# Patient Record
Sex: Male | Born: 1975 | Race: White | Hispanic: No | Marital: Married | Smoking: Never smoker
Health system: Southern US, Community
[De-identification: ages and names within clinical notes are randomized; demographics above are authoritative.]

## PROBLEM LIST (undated history)

## (undated) DIAGNOSIS — I10 Essential (primary) hypertension: Secondary | ICD-10-CM

## (undated) DIAGNOSIS — K760 Fatty (change of) liver, not elsewhere classified: Secondary | ICD-10-CM

## (undated) DIAGNOSIS — E785 Hyperlipidemia, unspecified: Secondary | ICD-10-CM

## (undated) DIAGNOSIS — E349 Endocrine disorder, unspecified: Secondary | ICD-10-CM

## (undated) HISTORY — DX: Endocrine disorder, unspecified: E34.9

## (undated) HISTORY — DX: Fatty (change of) liver, not elsewhere classified: K76.0

## (undated) HISTORY — DX: Essential (primary) hypertension: I10

## (undated) HISTORY — PX: TYMPANOSTOMY TUBE PLACEMENT: SHX32

## (undated) HISTORY — DX: Hyperlipidemia, unspecified: E78.5

---
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
## 2004-06-23 DIAGNOSIS — I10 Essential (primary) hypertension: Secondary | ICD-10-CM

## 2004-06-23 HISTORY — DX: Essential (primary) hypertension: I10

## 2007-01-04 ENCOUNTER — Encounter: Payer: Self-pay | Admitting: Family Medicine

## 2007-01-04 LAB — CONVERTED CEMR LAB: Hgb A1c MFr Bld: 5.2 %

## 2007-06-02 ENCOUNTER — Ambulatory Visit: Payer: Self-pay | Admitting: Family Medicine

## 2007-06-02 DIAGNOSIS — B351 Tinea unguium: Secondary | ICD-10-CM | POA: Insufficient documentation

## 2007-06-02 DIAGNOSIS — E785 Hyperlipidemia, unspecified: Secondary | ICD-10-CM | POA: Insufficient documentation

## 2007-06-02 LAB — CONVERTED CEMR LAB
ALT: 61 units/L — ABNORMAL HIGH (ref 0–53)
BUN: 23 mg/dL (ref 6–23)
CO2: 25 meq/L (ref 19–32)
Cholesterol: 206 mg/dL — ABNORMAL HIGH (ref 0–200)
Creatinine, Ser: 0.98 mg/dL (ref 0.40–1.50)
HDL: 37 mg/dL — ABNORMAL LOW (ref >39)
Total Bilirubin: 1.7 mg/dL — ABNORMAL HIGH (ref 0.3–1.2)
Total CHOL/HDL Ratio: 5.6
VLDL: 21 mg/dL (ref 0–40)

## 2007-06-03 ENCOUNTER — Encounter: Payer: Self-pay | Admitting: Family Medicine

## 2007-10-12 ENCOUNTER — Encounter: Payer: Self-pay | Admitting: Family Medicine

## 2007-10-25 ENCOUNTER — Encounter: Payer: Self-pay | Admitting: Family Medicine

## 2008-10-30 ENCOUNTER — Ambulatory Visit: Payer: Self-pay | Admitting: Family Medicine

## 2008-10-30 DIAGNOSIS — R030 Elevated blood-pressure reading, without diagnosis of hypertension: Secondary | ICD-10-CM | POA: Insufficient documentation

## 2008-10-30 DIAGNOSIS — I10 Essential (primary) hypertension: Secondary | ICD-10-CM | POA: Insufficient documentation

## 2008-10-30 DIAGNOSIS — R05 Cough: Secondary | ICD-10-CM

## 2008-10-30 DIAGNOSIS — R059 Cough, unspecified: Secondary | ICD-10-CM | POA: Insufficient documentation

## 2008-10-31 ENCOUNTER — Encounter: Payer: Self-pay | Admitting: Family Medicine

## 2008-11-01 ENCOUNTER — Telehealth (INDEPENDENT_AMBULATORY_CARE_PROVIDER_SITE_OTHER): Payer: Self-pay | Admitting: *Deleted

## 2008-11-01 LAB — CONVERTED CEMR LAB
ALT: 59 units/L — ABNORMAL HIGH (ref 0–53)
Albumin: 4.5 g/dL (ref 3.5–5.2)
CO2: 24 meq/L (ref 19–32)
Calcium: 9.8 mg/dL (ref 8.4–10.5)
Chloride: 108 meq/L (ref 96–112)
Cholesterol: 185 mg/dL (ref 0–200)
Glucose, Bld: 94 mg/dL (ref 70–99)
PSA, Free Pct: 20 — ABNORMAL LOW (ref >25)
Potassium: 4.5 meq/L (ref 3.5–5.3)
Sodium: 143 meq/L (ref 135–145)
Total Protein: 6.8 g/dL (ref 6.0–8.3)
Triglycerides: 150 mg/dL — ABNORMAL HIGH (ref <150)

## 2009-03-06 ENCOUNTER — Telehealth: Payer: Self-pay | Admitting: Family Medicine

## 2009-03-06 DIAGNOSIS — R748 Abnormal levels of other serum enzymes: Secondary | ICD-10-CM | POA: Insufficient documentation

## 2009-03-07 ENCOUNTER — Encounter: Payer: Self-pay | Admitting: Family Medicine

## 2009-03-09 LAB — CONVERTED CEMR LAB: ALT: 38 units/L (ref 0–53)

## 2009-03-16 ENCOUNTER — Telehealth (INDEPENDENT_AMBULATORY_CARE_PROVIDER_SITE_OTHER): Payer: Self-pay | Admitting: *Deleted

## 2009-03-22 ENCOUNTER — Ambulatory Visit: Payer: Self-pay | Admitting: Family Medicine

## 2009-03-22 DIAGNOSIS — H9209 Otalgia, unspecified ear: Secondary | ICD-10-CM | POA: Insufficient documentation

## 2009-07-06 ENCOUNTER — Ambulatory Visit: Payer: Self-pay | Admitting: Family Medicine

## 2009-07-06 DIAGNOSIS — J019 Acute sinusitis, unspecified: Secondary | ICD-10-CM | POA: Insufficient documentation

## 2010-04-09 ENCOUNTER — Ambulatory Visit: Payer: Self-pay | Admitting: Family Medicine

## 2010-04-16 ENCOUNTER — Encounter: Payer: Self-pay | Admitting: Family Medicine

## 2010-04-22 LAB — CONVERTED CEMR LAB
ALT: 71 units/L — ABNORMAL HIGH (ref 0–53)
Albumin: 4.5 g/dL (ref 3.5–5.2)
CO2: 28 meq/L (ref 19–32)
Cholesterol: 249 mg/dL — ABNORMAL HIGH (ref 0–200)
Glucose, Bld: 97 mg/dL (ref 70–99)
LDL Cholesterol: 189 mg/dL — ABNORMAL HIGH (ref 0–99)
Potassium: 4.5 meq/L (ref 3.5–5.3)
Sodium: 140 meq/L (ref 135–145)
Total Bilirubin: 0.9 mg/dL (ref 0.3–1.2)
Total Protein: 6.6 g/dL (ref 6.0–8.3)
Triglycerides: 103 mg/dL (ref <150)
VLDL: 21 mg/dL (ref 0–40)

## 2010-04-26 ENCOUNTER — Encounter: Admission: RE | Admit: 2010-04-26 | Discharge: 2010-04-26 | Payer: Self-pay | Admitting: Family Medicine

## 2010-06-03 ENCOUNTER — Ambulatory Visit: Payer: Self-pay | Admitting: Family Medicine

## 2010-06-03 DIAGNOSIS — K648 Other hemorrhoids: Secondary | ICD-10-CM | POA: Insufficient documentation

## 2010-06-04 ENCOUNTER — Encounter: Payer: Self-pay | Admitting: Family Medicine

## 2010-07-23 NOTE — Assessment & Plan Note (Signed)
Summary: sinusitis   Vital Signs:  Patient profile:   35 year old male Height:      74.5 inches Weight:      269 pounds BMI:     34.20 O2 Sat:      100 % on Room air Temp:     98.1 degrees F oral Pulse rate:   67 / minute BP sitting:   145 / 87  (left arm) Cuff size:   large  Vitals Entered By: Payton Spark CMA (July 06, 2009 4:01 PM)  O2 Flow:  Room air CC: Congestion x 2 weeks.    Primary Care Provider:  Seymour Bars DO  CC:  Congestion x 2 weeks. Marland Kitchen  History of Present Illness: 35 yo WM presents for head congestion x 2 wks.  Started with ST and runny nose with postnasal drip.  He has taken a full box of Mucinex which helped but he is not getting better.  He is blowing out yellow mucous.  Some HAs.  Sinus pressure behind the nose.  No cough.  Using Nyquil.  No ear pain.  No GI upset.  His daughter had a cold.    Allergies: No Known Drug Allergies  Past History:  Past Medical History: Reviewed history from 10/25/2007 and no changes required. HTN in 2006, treated for only 6 mos hyperlipidemia hx of fatty liver hx of AST/ALT elevation from statin  Past Surgical History: Reviewed history from 06/02/2007 and no changes required. T tubes Sinus polyps 1995  Social History: Reviewed history from 06/02/2007 and no changes required. 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 Never smoked. Occas ETOH. 3 coffee/ day. Walks 20 min 2 x a wk, eats healthy  Review of Systems      See HPI  Physical Exam  General:  alert, well-developed, well-nourished, and well-hydrated.   Head:  normocephalic and atraumatic.  tender over frontal sinuses Eyes:  conjunctiva clear Ears:  EACs patent; TMs translucent and gray with good cone of light and bony landmarks.  Nose:  nasal congestion present with boggy turbinates Mouth:  throat mildly injected with clear postnasal drip Neck:  no masses.   Lungs:  Normal respiratory effort, chest expands  symmetrically. Lungs are clear to auscultation, no crackles or wheezes. Heart:  Normal rate and regular rhythm. S1 and S2 normal without gallop, murmur, click, rub or other extra sounds. Skin:  color normal.   Cervical Nodes:  No lymphadenopathy noted   Impression & Recommendations:  Problem # 1:  ACUTE SINUSITIS, UNSPECIFIED (ICD-461.9) Treat with Amoxicillin for 10 days.  Use Mucinex D and Ibuprofen as needed.  Call if not improved in 10 days.   His updated medication list for this problem includes:    Amoxicillin 500 Mg Caps (Amoxicillin) .Marland Kitchen... 1 capsule by mouth three times a day x 10 days  Complete Medication List: 1)  Crestor 5 Mg Tabs (Rosuvastatin calcium) .... Take 1 tablet by mouth once a day at bedtime 2)  Proair Hfa 108 (90 Base) Mcg/act Aers (Albuterol sulfate) .... 2 puffs 3-4 x a day as needed 3)  Amoxicillin 500 Mg Caps (Amoxicillin) .Marland Kitchen.. 1 capsule by mouth three times a day x 10 days  Patient Instructions: 1)  Take 10 days of Amoxicillin for sinusitis. 2)  Use Mucinex D and Advil as needed for symptomatic relief.   3)  Call if not improved in 10 days. Prescriptions: AMOXICILLIN 500 MG CAPS (AMOXICILLIN) 1 capsule by mouth  three times a day x 10 days  #30 x 0   Entered and Authorized by:   Seymour Bars DO   Signed by:   Seymour Bars DO on 07/06/2009   Method used:   Electronically to        [facility] [phone]* (retail)       [address] [place], [place]  [address]       Ph: [phone] or [phone]       Fax: [phone]   RxID:   [id number]

## 2010-07-23 NOTE — Assessment & Plan Note (Signed)
Summary: sinusitis/ lab order   Vital Signs:  Patient profile:   35 year old male Height:      74.5 inches Weight:      264 pounds BMI:     33.56 O2 Sat:      100 % on Room air Temp:     98.6 degrees F oral Pulse rate:   58 / minute BP sitting:   133 / 79  (left arm) Cuff size:   large  Vitals Entered By: Payton Spark CMA (April 09, 2010 11:08 AM)  O2 Flow:  Room air CC: Facial pressure and congestion x 10 days.    Primary Care Provider:  Seymour Bars DO  CC:  Facial pressure and congestion x 10 days. Marland Kitchen  History of Present Illness: 35 yo WM presents for 10-12 days of sinus pain and pressure.  His daughter has had a cold.  Denies fevers.  He is using Nyquil at night and Dayquil during the day.  He has a little dry cough.  Has mostly postnasal drip and a thick yellow rhinorrhea.  Has a scratchy throat.  No GI upset or rash.  No SOB or wheezing.    Current Medications (verified): 1)  Crestor 5 Mg  Tabs (Rosuvastatin Calcium) .... Take 1 Tablet By Mouth Once A Day At Bedtime  Allergies (verified): No Known Drug Allergies  Past History:  Past Medical History: Reviewed history from 10/25/2007 and no changes required. HTN in 2006, treated for only 6 mos hyperlipidemia hx of fatty liver hx of AST/ALT elevation from statin  Past Surgical History: Reviewed history from 06/02/2007 and no changes required. 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 Never smoked. Occas ETOH. 3 coffee/ day. Walks 20 min 2 x a wk, eats healthy  Review of Systems      See HPI  Physical Exam  General:  alert, well-developed, well-nourished, and well-hydrated.   Head:  normocephalic and atraumatic.  R ethmoid TTP  Eyes:  wears glasses; conjuntiva clear; EOMI Ears:  dull retracted TMs Nose:  nasal congestion with boggy turbinates present Mouth:  o/p slightly injected with postnasal drip and 1+  tonsilar hypertrophy Neck:  no masses.   Lungs:  Normal respiratory effort, chest expands symmetrically. Lungs are clear to auscultation, no crackles or wheezes. Heart:  Normal rate and regular rhythm. S1 and S2 normal without gallop, murmur, click, rub or other extra sounds. Skin:  color normal and no rashes.   Cervical Nodes:  shotty submandibular nodes   Impression & Recommendations:  Problem # 1:  ACUTE SINUSITIS, UNSPECIFIED (ICD-461.9) Treat with 10 days of Amoxicillin along with Mucinex D q 12 hrs x 10 days. Call if not improving.  Suspect underlying allergies given hx of sinus surgery for polyps. His updated medication list for this problem includes:    Amoxicillin 500 Mg Caps (Amoxicillin) .Marland Kitchen... 1 capsule by mouth three times a day x 10 days  Problem # 2:  HYPERLIPIDEMIA (ICD-272.4)  Due for labs.  Order printed. His updated medication list for this problem includes:    Crestor 5 Mg Tabs (Rosuvastatin calcium) .Marland Kitchen... Take 1 tablet by mouth once a day at bedtime  Orders: T-Lipid Profile ([id number])  Complete Medication List: 1)  Crestor 5 Mg Tabs (Rosuvastatin calcium) .... Take 1 tablet by mouth once a day at bedtime 2)  Amoxicillin 500 Mg Caps (Amoxicillin) .Marland Kitchen.. 1 capsule by mouth  three times a day x 10 days  Other Orders: T-Comprehensive Metabolic Panel [id number]) T-PSA ([id number])  Patient Instructions: 1)  Update fasting labs one morning downstairs. 2)  Will call you w/ your results. 3)  Take 10 days of Amoxicillin for sinusitis. 4)  Take with OTC Mucinex D every 12 hrs for head congestion. 5)  Call if not improved after 10 days. 6)  REturn for f/u a PHYSICAL in 6 mos Prescriptions: AMOXICILLIN 500 MG CAPS (AMOXICILLIN) 1 capsule by mouth three times a day x 10 days  #30 x 0   Entered and Authorized by:   Seymour Bars DO   Signed by:   Seymour Bars DO on 04/09/2010   Method used:   [facility]ically to        [facility] [phone]* (retail)       [address] [place], [place]  [address]       Ph: [phone] or [phone]       Fax: [phone]   RxID:   [id number]    Orders Added: 1)  T-Comprehensive Metabolic Panel [[id number]] 2)  T-Lipid Profile [[id number]] 3)  T-PSA [[id number]] 4)  Est. Patient Level III [01093]

## 2010-07-25 NOTE — Consult Note (Signed)
Summary: [facility]   Imported By: Lanelle Bal 07/03/2010 09:33:03  _____________________________________________________________________  External Attachment:    Type:   Image     Comment:   External Document

## 2010-07-25 NOTE — Assessment & Plan Note (Signed)
Summary: bleeding hemorrhoid   Vital Signs:  Patient profile:   35 year old male Height:      74.5 inches Weight:      264 pounds BMI:     33.56 O2 Sat:      99 % on Room air Pulse rate:   62 / minute BP sitting:   141 / 82  (left arm) Cuff size:   large  Vitals Entered By: Payton Spark CMA (June 03, 2010 2:51 PM)  O2 Flow:  Room air CC: ? bleeding hemorroid x 3 days.   Primary Care Provider:  Seymour Bars DO  CC:  ? bleeding hemorroid x 3 days.Marland Kitchen  History of Present Illness: 35 yo WM presents for a painful rectum x 3 days with a large BM and some blood on the toilet paper.  He had increased bleeding on Sunday even w/o a BM.  His pain has improved.  denies painful defecation today.    Denies hx of hemorrhoids, colon polyps or a fam hx of colon cancer.  He denies preceeding problems with constipation, straining or diarrhea.  Has continued to drip blood in his underwear (spotting) thru the weekend and today.  Current Medications (verified): 1)  Crestor 10 Mg Tabs (Rosuvastatin Calcium) .Marland Kitchen.. 1 Tab By Mouth Qhs  Allergies (verified): No Known Drug Allergies  Past History:  Past Medical History: Reviewed history from 10/25/2007 and no changes required. HTN in 2006, treated for only 6 mos hyperlipidemia hx of fatty liver hx of AST/ALT elevation from statin  Social History: Reviewed history from 04/09/2010 and no changes required. 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 Never smoked. Occas ETOH. 3 coffee/ day. Walks 20 min 2 x a wk, eats healthy  Review of Systems      See HPI  Physical Exam  General:  alert, well-developed, well-nourished, and well-hydrated.   Head:  normocephalic and atraumatic.   Eyes:  no conjunctival pallor; sclera non icteric Mouth:  pharynx pink and moist.   Lungs:  Normal respiratory effort, chest expands symmetrically. Lungs are clear to auscultation, no crackles or wheezes. Heart:  Normal  rate and regular rhythm. S1 and S2 normal without gallop, murmur, click, rub or other extra sounds. Rectal:  palpable hemorrhoid at the anal sphincter with active bleeding.  grossly + for blood, non tender Skin:  color normal.     Impression & Recommendations:  Problem # 1:  HEMORRHOIDS, INTERNAL, WITH BLEEDING (ICD-455.2) Actively bleeding, non painful hemorrhoid at anal sphincter with bleeding outside of BM, 1st episode. Will treat with sitz baths, moist wipes, Colace as needed and Anucort, short term use. Due to bleeding outside of BMs, will refer him to gen surgery for treatment.   Orders: Surgical Referral (Surgery)  Complete Medication List: 1)  Crestor 10 Mg Tabs (Rosuvastatin calcium) .Marland Kitchen.. 1 tab by mouth qhs 2)  Anucort-hc 25 Mg Supp (Hydrocortisone acetate) .Marland Kitchen.. 1 suppository pr daily x 7 days  Patient Instructions: 1)  Sit in warm bath water for 10 min 1-2 x a day to shrink hemorrhoid size. 2)  Use moist wipes with each BM. 3)  Use Colace if needed as a stool softener. 4)  Use Rx Anucort once daily. 5)  Will set you up with surgeon here for possible injection. 6)  Call if any increased pain or bleeding occurs. Prescriptions: ANUCORT-HC 25 MG SUPP (HYDROCORTISONE ACETATE) 1 suppository PR daily x 7 days  #7 x 0  Entered and Authorized by:   Seymour Bars DO   Signed by:   Seymour Bars DO on 06/03/2010   Method used:   [facility]ronically to        [facility] [phone][facility] (retail)       [address] [place], [place]  [address]       Ph: [phone] or [phone]       Fax: [phone]   RxID:   [id number]    Orders Added: 1)  Surgical Referral [Surgery] 2)  Est. Patient Level III [34742]

## 2010-07-29 ENCOUNTER — Telehealth: Payer: Self-pay | Admitting: Family Medicine

## 2010-07-31 ENCOUNTER — Encounter: Payer: Self-pay | Admitting: Family Medicine

## 2010-08-01 LAB — CONVERTED CEMR LAB
ALT: 67 U/L — ABNORMAL HIGH
AST: 33 U/L
Albumin: 4.6 g/dL
Alkaline Phosphatase: 45 U/L
Bilirubin, Direct: 0.2 mg/dL
Direct LDL: 96 mg/dL
Indirect Bilirubin: 1.2 mg/dL — ABNORMAL HIGH
Total Bilirubin: 1.4 mg/dL — ABNORMAL HIGH
Total Protein: 6.5 g/dL

## 2010-08-08 NOTE — Progress Notes (Signed)
Summary: Labwork  Phone Note Call from Patient Call back at [facility] Phone [phone]   Caller: Patient Summary of Call: Pt is going to the lab tomorrow morning for his bloodwork, pls send the orders Initial call taken by: Lannette Donath,  July 29, 2010 1:05 PM  Follow-up for Phone Call        printed. Follow-up by: Seymour Bars DO,  July 29, 2010 1:12 PM

## 2010-08-22 ENCOUNTER — Encounter: Payer: Self-pay | Admitting: Family Medicine

## 2010-09-10 NOTE — Consult Note (Signed)
Summary: [facility]   Imported By: Maryln Gottron 09/03/2010 09:34:04  _____________________________________________________________________  External Attachment:    Type:   Image     Comment:   External Document

## 2010-10-14 ENCOUNTER — Encounter: Payer: Self-pay | Admitting: Family Medicine

## 2010-10-15 ENCOUNTER — Encounter: Payer: Self-pay | Admitting: Family Medicine

## 2010-10-30 ENCOUNTER — Other Ambulatory Visit: Payer: Self-pay | Admitting: Family Medicine

## 2011-01-30 ENCOUNTER — Other Ambulatory Visit: Payer: Self-pay | Admitting: Family Medicine

## 2011-04-17 ENCOUNTER — Ambulatory Visit (INDEPENDENT_AMBULATORY_CARE_PROVIDER_SITE_OTHER): Payer: Managed Care, Other (non HMO) | Admitting: Family Medicine

## 2011-04-17 ENCOUNTER — Encounter: Payer: Self-pay | Admitting: Family Medicine

## 2011-04-17 DIAGNOSIS — R5383 Other fatigue: Secondary | ICD-10-CM | POA: Insufficient documentation

## 2011-04-17 DIAGNOSIS — N529 Male erectile dysfunction, unspecified: Secondary | ICD-10-CM | POA: Insufficient documentation

## 2011-04-17 DIAGNOSIS — E785 Hyperlipidemia, unspecified: Secondary | ICD-10-CM

## 2011-04-17 DIAGNOSIS — R030 Elevated blood-pressure reading, without diagnosis of hypertension: Secondary | ICD-10-CM

## 2011-04-17 DIAGNOSIS — R5381 Other malaise: Secondary | ICD-10-CM

## 2011-04-17 MED ORDER — ROSUVASTATIN CALCIUM 20 MG PO TABS: 20.0000 mg | ORAL_TABLET | Freq: Every day | ORAL | Status: DC

## 2011-04-17 MED ORDER — TADALAFIL 10 MG PO TABS: 10.0000 mg | ORAL_TABLET | ORAL | Status: DC | PRN

## 2011-04-17 NOTE — Assessment & Plan Note (Signed)
Erectile dysfunction we will check a testosterone level BXII TSH and as well as lipid panel. Also do CBC and CMP.  We'll have him come back for complete physical in about 4 months. We discussed options such as Cialis10 mg when necessary on a daily basis as needed. Another option is 5 mg Cialis every day but he says when he wants to come in today. Just by taking Cialis 10 mg and having peace of mind knowing that he'll be able to perform adequately when needed may alleviate the problem and may take care of this we'll try 10 mg first.

## 2011-04-17 NOTE — Patient Instructions (Signed)
Erectile Dysfunction Erectile dysfunction (ED) is the inability to get a good enough erection to have sexual intercourse. ED may involve:  Inability to get an erection.   Lack of enough hardness to allow penetration.   Loss of the erection before sex is finished.   Premature ejaculation.   Any combination of these problems if they occur more than 25% of the time.  CAUSES  Certain drugs, such as:   Pain relievers.   Antihistamines.   Antidepressants.   Blood pressure medicines.   Water pills.   Ulcer medicines.   Muscle relaxants.   Illegal drugs.   Excessive drinking.   Psychological causes, such as:   Anxiety.   Depression.   Sadness.   Exhaustion.   Performance fear.   Stress.   Physical causes, such as:   Artery problems. This may include diabetes, smoking, liver disease, or atherosclerosis.   High blood pressure.   Hormonal problems, such as low testosterone.   Obesity.   Nerve problems. This may include back or pelvic injuries, diabetes, multiple sclerosis, Parkinson's disease, or some surgeries.  SYMPTOMS  Inability to get an erection.   Lack of enough hardness to allow penetration.   Loss of the erection before sex is finished.   Premature ejaculation.   Normal erections at some times, but with frequent unsatisfactory episodes.   Orgasms that are not satisfactory in sensation or frequency.   Low sexual satisfaction in either partner because of erection problems.   A curved penis occurring with erection. The curve may cause pain or may be too curved to allow for intercourse.   Never having nighttime erections.  DIAGNOSIS Your caregiver can often diagnose this condition by:  Performing a physical exam to find other diseases or specific problems with the penis.   Asking you detailed questions about the problem.   Performing blood tests to check for diabetes or to measure hormone levels.   Performing urine tests to find other  underlying health conditions.   Performing an ultrasound to check for scarring.   Performing a test to check blood flow to the penis.   Doing a sleep study at home to measure nighttime erections.  TREATMENT   You may be prescribed medicines by mouth.   You may be given medicine injections into the penis.   You may be prescribed a vacuum pump with a ring.   Penile implant surgery may be performed. You may receive:   An inflatable implant.   A semi-rigid implant.   Blood vessel surgery may be performed.  HOME CARE INSTRUCTIONS  Take all medicine as directed by your caregiver. Do not take any other medicines without talking to your caregiver first.   Follow your caregiver's directions for specific treatments as prescribed.   Follow up with your caregiver as directed.  Document Released: 06/06/2000 Document Revised: 02/19/2011 Document Reviewed: 09/29/2010 [facility] Patient Information 2012 [place], [place].  Blood Pressure Record Sheet Your blood pressure on this visit to the Emergency Department or Clinic is elevated. This does not necessarily mean you have hypertension, but it does mean that your blood pressure needs to be rechecked. Many times blood pressure increases because of illness, pain, anxiety, or other factors. We recommend that you get a series of blood pressure readings done over a period of about 5 days. It is best to get a reading in the morning and one in the evening. You should make sure you sit and relax for 1 to 5 minutes before the reading  is taken. Write the readings down and make a follow-up appointment with your doctor to discuss the results. If there is not a free clinic or a drug store with blood pressure taking machines near you, you can purchase good blood pressure taking equipment from a drug store, often for less than the price of a physician's office call. Having one in the home also allows you the convenience of taking your blood pressure while you are  home and in relaxed surroundings. Your blood pressure in the Emergency Department or Clinic on ________ was ____________________. BLOOD PRESSURE LOG Date: _______________________  a.m. _____________________   p.m. _____________________  Date: _______________________  a.m. _____________________   p.m. _____________________  Date: _______________________  a.m. _____________________   p.m. _____________________  Date: _______________________  a.m. _____________________   p.m. _____________________  Date: _______________________  a.m. _____________________   p.m. _____________________  Document Released: 03/08/2003 Document Revised: 02/19/2011 Document Reviewed: 06/09/2005 [employer] Patient Information 2012 [place], [place].

## 2011-04-17 NOTE — Progress Notes (Signed)
  Subjective:    Patient ID: Shawn Richards, male    DOB: 08-17-1975, 35 y.o.   MRN: [id number]  HPI Comments: Has been on Crestor 10 mg weekly Need to check another lipid panel spent over a year since his last one. He also requests that he could go up to 20 mg and take a half tablet to one tablet daily dispense as his parents are doing since they are  also taking Crestor.  Erectile Dysfunction This is a recurrent problem. The current episode started more than 1 month ago. The problem has been gradually worsening since onset. The nature of his difficulty is maintaining erection. He reports his erection duration to be 1 to 5 minutes. Irritative symptoms do not include frequency, nocturia or urgency. Obstructive symptoms do not include dribbling, an intermittent stream or straining. The symptoms are aggravated by nothing. Past treatments include nothing. He has had no adverse reactions caused by medications. Risk factors include no known risk factors.  Hyperlipidemia This is a chronic problem. The current episode started more than 1 year ago. The problem is controlled. Recent lipid tests were reviewed and are variable. Factors aggravating his hyperlipidemia include no known factors. Current antihyperlipidemic treatment includes statins. The current treatment provides significant improvement of lipids. There are no compliance problems.  Risk factors for coronary artery disease include family history.   Elevated blood pressure he's had a history of elevation his blood pressure he's not taking medication for that he discounts the doses as his body habitus. Shawn Richards is more than body habitus and that if his blood pressures were staying this high we definitely need to  treat.   Review of Systems  Genitourinary: Negative for urgency, frequency and nocturia.       Objective:   Physical Exam  Constitutional: He appears well-developed and well-nourished.  Abdominal: Hernia confirmed negative in the  right inguinal area and confirmed negative in the left inguinal area.  Genitourinary: Testes normal and penis normal. No phimosis, paraphimosis, penile erythema or penile tenderness. No discharge found.          Assessment & Plan:

## 2011-04-17 NOTE — Assessment & Plan Note (Signed)
Will obtain a hepatic and lipid panel since some of his liver enzymes were elevated and will check a lipid disease on Crestor 10 mg. He did request that we could increase his Crestor to 20 and maintaining a half tablet if itwas  possibility.

## 2011-04-17 NOTE — Assessment & Plan Note (Signed)
Elevated blood pressure. I discussed my concern asked Mr. Shawn Richards to take the log at least once every 2 weeks and his blood pressure to home bring it back and comes in for physical with it to make a determination about blood pressure medicine.

## 2011-04-22 ENCOUNTER — Telehealth: Payer: Self-pay | Admitting: *Deleted

## 2011-04-22 LAB — CBC WITH DIFFERENTIAL/PLATELET
Basophils Absolute: 0 10*3/uL (ref 0.0–0.1)
Eosinophils Relative: 3 % (ref 0–5)
Lymphocytes Relative: 30 % (ref 12–46)
Lymphs Abs: 1.5 10*3/uL (ref 0.7–4.0)
Neutro Abs: 2.9 10*3/uL (ref 1.7–7.7)
Neutrophils Relative %: 58 % (ref 43–77)
Platelets: 186 10*3/uL (ref 150–400)
RBC: 4.67 MIL/uL (ref 4.22–5.81)
RDW: 13.9 % (ref 11.5–15.5)
WBC: 5.1 10*3/uL (ref 4.0–10.5)

## 2011-04-22 LAB — COMPREHENSIVE METABOLIC PANEL
ALT: 66 U/L — ABNORMAL HIGH (ref 0–53)
CO2: 30 mEq/L (ref 19–32)
Calcium: 9.9 mg/dL (ref 8.4–10.5)
Chloride: 106 mEq/L (ref 96–112)
Glucose, Bld: 99 mg/dL (ref 70–99)
Sodium: 143 mEq/L (ref 135–145)
Total Protein: 6.6 g/dL (ref 6.0–8.3)

## 2011-04-22 LAB — LIPID PANEL
Cholesterol: 156 mg/dL (ref 0–200)
LDL Cholesterol: 102 mg/dL — ABNORMAL HIGH (ref 0–99)
Total CHOL/HDL Ratio: 3.8 Ratio
VLDL: 13 mg/dL (ref 0–40)

## 2011-04-22 NOTE — Progress Notes (Signed)
Addended by: Ellsworth Lennox on: 04/22/2011 08:28 AM   Modules accepted: Orders

## 2011-04-23 LAB — TESTOSTERONE, FREE, TOTAL, SHBG
Sex Hormone Binding: 21 nmol/L (ref 13–71)
Testosterone, Free: 60.4 pg/mL (ref 47.0–244.0)
Testosterone-% Free: 2.4 % (ref 1.6–2.9)
Testosterone: 246.43 ng/dL — ABNORMAL LOW (ref 250–890)

## 2011-05-12 ENCOUNTER — Other Ambulatory Visit: Payer: Self-pay | Admitting: *Deleted

## 2011-05-12 DIAGNOSIS — E785 Hyperlipidemia, unspecified: Secondary | ICD-10-CM

## 2011-05-12 MED ORDER — ROSUVASTATIN CALCIUM 20 MG PO TABS: 20.0000 mg | ORAL_TABLET | Freq: Every day | ORAL | Status: DC

## 2011-05-14 NOTE — Telephone Encounter (Signed)
error 

## 2011-05-26 ENCOUNTER — Encounter: Payer: Self-pay | Admitting: Family Medicine

## 2011-05-27 ENCOUNTER — Encounter: Payer: Self-pay | Admitting: Family Medicine

## 2011-05-27 ENCOUNTER — Ambulatory Visit (INDEPENDENT_AMBULATORY_CARE_PROVIDER_SITE_OTHER): Payer: Managed Care, Other (non HMO) | Admitting: Family Medicine

## 2011-05-27 VITALS — BP 136/77 | HR 56 | Ht 75.0 in | Wt 262.0 lb

## 2011-05-27 DIAGNOSIS — E291 Testicular hypofunction: Secondary | ICD-10-CM

## 2011-05-27 DIAGNOSIS — E349 Endocrine disorder, unspecified: Secondary | ICD-10-CM

## 2011-05-27 DIAGNOSIS — N529 Male erectile dysfunction, unspecified: Secondary | ICD-10-CM

## 2011-05-27 MED ORDER — TADALAFIL 20 MG PO TABS: 20.0000 mg | ORAL_TABLET | Freq: Every day | ORAL | Status: AC | PRN

## 2011-05-27 MED ORDER — TESTOSTERONE 30 MG/ACT TD SOLN: 30.0000 mg | TRANSDERMAL | Status: DC

## 2011-05-27 NOTE — Patient Instructions (Signed)
Testosterone skin gel What is this medicine? TESTOSTERONE (tes TOS ter one) is the main male hormone. It supports normal male traits such as muscle growth, facial hair, and deep voice. This gel is used in males to treat low testosterone levels. This medicine may be used for other purposes; ask your health care provider or pharmacist if you have questions. What should I tell my health care provider before I take this medicine? They need to know if you have any of these conditions: -breast cancer -diabetes -heart disease -if a male partner is pregnant or trying to get pregnant -kidney disease -liver disease -lung disease -prostate cancer, enlargement -an unusual or allergic reaction to testosterone, soy proteins, other medicines, foods, dyes, or preservatives -pregnant or trying to get pregnant -breast-feeding How should I use this medicine? This medicine is for external use only. This medicine is applied at the same time every day (preferably in the morning) to clean, dry, intact skin. If you take a bath or shower in the morning, apply the gel after the bath or shower. Follow the directions on the prescription label. Make sure that you are using your testosterone gel product correctly and applying it only to the appropriate skin area (see below). Allow the skin to dry a few minutes then cover with clothing to prevent others from coming in contact with the medicine on your skin. The gel is flammable. Avoid fire, flame, or smoking until the gel has dried. Wash your hands with soap and water after use. For AndroGel Packets: Open the packet(s) needed for your dose. You can put the entire dose into your palm all at once or just a little at a time to apply. If you prefer, you can instead squeeze the gel directly onto the area you are applying it to. Apply on the shoulders, upper arm, or abdomen as directed. Do not apply to the scrotum or genitals. Be sure you use the correct total dose. It is best to  wait 5 to 6 hours after application of the gel before showering or swimming. For AndroGel 1%: Pump the dose into the palm of your hand. You can put the entire dose into your palm all at once or just a little at a time to apply. If you prefer, you can instead pump the gel directly onto the area you are applying it to. Apply on the shoulders, upper arm, or abdomen as directed. Do not apply to the scrotum or genitals. Be sure you use the correct total dose. It is best to wait for 5 to 6 hours after application of the gel before showering or swimming. For AndroGel 1.62%: Pump the dose into the palm of your hand. Dispense one pump of gel at a time into the palm of your hand before applying it. If you prefer, you can instead pump the gel directly onto the area you are applying it to. Apply on the shoulders and upper arms as directed. Do not apply to other parts of the body including the abdomen or genitals. Be sure you use the correct total dose. It is best to wait 2 hours after application of the gel before washing, showering, or swimming. For Testim: Open the tube(s) needed for your dose. Squeeze the gel from the tube into the palm of your hand. Apply on the shoulders or upper arms as directed. Do not apply to the scrotum, genitals, or abdomen. Be sure you use the correct total dose. Do not shower or swim for at least 2   hours after application of the gel. For Fortesta: Use the multi-dose pump to pump the gel directly onto the area you are applying it to. Apply on the thighs as directed. Do not apply to the abdomen, penis, scrotum, shoulders or upper arms. Gently rub the gel onto the skin using your finger. Be sure you use the correct total dose. Do not shower or swim for at least 2 hours after application of the gel. A special MedGuide will be given to you by the pharmacist with each prescription and refill. Be sure to read this information carefully each time. Talk to your pediatrician regarding the use of this  medicine in children. Special care may be needed. Overdosage: If you think you have taken too much of this medicine contact a poison control center or emergency room at once. NOTE: This medicine is only for you. Do not share this medicine with others. What if I miss a dose? If you miss a dose, use it as soon as you can. If it is almost time for your next dose, use only that dose. Do not use double or extra doses. What may interact with this medicine? -medicines for diabetes -medicines that treat or prevent blood clots like warfarin -oxyphenbutazone -propranolol -steroid medicines like prednisone or cortisone This list may not describe all possible interactions. Give your health care provider a list of all the medicines, herbs, non-prescription drugs, or dietary supplements you use. Also tell them if you smoke, drink alcohol, or use illegal drugs. Some items may interact with your medicine. What should I watch for while using this medicine? Visit your doctor or health care professional for regular checks on your progress. They will need to check the level of testosterone in your blood. This medicine can transfer from your body to others. If a person or pet comes in contact with the area where this medicine was applied to your skin, they may have a serious risk of side effects. If you cannot avoid skin-to-skin contact with another person, make sure the site where this medicine was applied is covered with clothing. If accidental contact happens, the skin of the person or pet should be washed right away with soap and water. Also, a male partner who is pregnant or trying to get pregnant should avoid contact with the gel or treated skin. This medicine may affect blood sugar levels. If you have diabetes, check with your doctor or health care professional before you change your diet or the dose of your diabetic medicine. This drug is banned from use in athletes by most athletic organizations. What side  effects may I notice from receiving this medicine? Side effects that you should report to your doctor or health care professional as soon as possible: -allergic reactions like skin rash, itching or hives, swelling of the face, lips, or tongue -breast enlargement -breathing problems -changes in mood, especially anger, depression, or rage -dark urine -general ill feeling or flu-like symptoms -light-colored stools -loss of appetite, nausea -nausea, vomiting -right upper belly pain -stomach pain -swelling of ankles -too frequent or persistent erections -trouble passing urine or change in the amount of urine -unusually weak or tired -yellowing of the eyes or skin Side effects that usually do not require medical attention (report to your doctor or health care professional if they continue or are bothersome): -acne -change in sex drive or performance -hair loss -headache This list may not describe all possible side effects. Call your doctor for medical advice about side effects. You may   report side effects to FDA at 1-800-FDA-1088. Where should I keep my medicine? Keep out of the reach of children. This medicine can be abused. Keep your medicine in a safe place to protect it from theft. Do not share this medicine with anyone. Selling or giving away this medicine is dangerous and against the law. Store at room temperature between 15 to 30 degrees C (59 to 86 degrees F). Keep closed until use. Protect from heat and light. This medicine is flammable. Avoid exposure to heat, fire, flame, and smoking. Throw away any unused medicine after the expiration date. NOTE: This sheet is a summary. It may not cover all possible information. If you have questions about this medicine, talk to your doctor, pharmacist, or health care provider.  2012, [employer]. (10/22/2009 4:45:50 PM) 

## 2011-05-27 NOTE — Progress Notes (Signed)
  Subjective:    Patient ID: STARK AGUINAGA, male    DOB: 12/03/1975, 35 y.o.   MRN: [id number]  HPI Patient's here for reviewing of his lab work. Patient testosterone was was on the low side. With his lack of energy increased dysfunction and overall fatigue the KUB candidate for supplemental testosterone replacement.   Review of Systems  Constitutional: Positive for fatigue.  Genitourinary:       Erectile dysfunction  All other systems reviewed and are negative.   BP 136/77  Pulse 56  Ht 6\' 3"  (1.905 m)  Wt 262 lb (118.842 kg)  BMI 32.75 kg/m2  SpO2 99%    Objective:   Physical Exam  Constitutional: He is oriented to person, place, and time. He appears well-developed and well-nourished.  Neurological: He is alert and oriented to person, place, and time.  Psychiatric: He has a normal mood and affect. His behavior is normal.          Assessment & Plan:  Hypotestosteronism and erectile dysfunction. Since Cialis 5 and 10 didn't do much for him and it was not until he took the 10 he could feel anything at all will increase Cialis to 20 mg one tablet to use as needed. We'll also place him on after discussion with him since he has young children at home currently the safest testosterone supplement other than injection is going to be axillary testosterone is a and we'll place him on that medication the Axiron. Apply one dose under each arm and return in 2 months followup.

## 2011-07-07 ENCOUNTER — Encounter: Payer: Self-pay | Admitting: Physician Assistant

## 2011-07-07 ENCOUNTER — Ambulatory Visit (INDEPENDENT_AMBULATORY_CARE_PROVIDER_SITE_OTHER): Payer: Managed Care, Other (non HMO) | Admitting: Physician Assistant

## 2011-07-07 VITALS — BP 133/74 | HR 63 | Temp 98.4°F | Ht 75.0 in | Wt 261.0 lb

## 2011-07-07 DIAGNOSIS — J329 Chronic sinusitis, unspecified: Secondary | ICD-10-CM

## 2011-07-07 MED ORDER — AZITHROMYCIN 250 MG PO TABS: ORAL_TABLET | ORAL | Status: DC

## 2011-07-07 MED ORDER — AMOXICILLIN 500 MG PO TABS: 1000.0000 mg | ORAL_TABLET | Freq: Three times a day (TID) | ORAL | Status: AC

## 2011-07-07 NOTE — Progress Notes (Signed)
  Subjective:    Patient ID: Shawn Richards, male    DOB: Jun 16, 1976, 36 y.o.   MRN: [id number]  HPI Patient has had head/sinus congestion for the past 6 weeks with no relief. Recently it has gotten much worse and he feels pressure behind eyes. He has tried Mucinex DM and nyquil with no relief. He denies fever, chills, ear pain, sore throat, n/v, SOB, or wheezing.   He has also recently started having nosebleeds that have lasted for a few seconds and resolved with pressure. He usually finds they are associated with a big sneeze or cough.  Nosebleeds with hard 12 over past . Head congestion.   Review of Systems     Objective:   Physical Exam  Constitutional: He is oriented to person, place, and time. He appears well-developed and well-nourished.  HENT:  Head: Normocephalic and atraumatic.  Right Ear: External ear normal.  Left Ear: External ear normal.  Mouth/Throat: Oropharynx is clear and moist. No oropharyngeal exudate.       Maxillary tenderness with palpation.  Edematous bilateral nares with dry blood clots from nosebleeds present.   Eyes: Right eye exhibits no discharge. Left eye exhibits no discharge.  Neck: Normal range of motion. Neck supple.  Cardiovascular: Normal rate, regular rhythm and normal heart sounds.   Pulmonary/Chest: Effort normal and breath sounds normal. He has no wheezes.  Neurological: He is alert and oriented to person, place, and time.  Skin: Skin is warm and dry.  Psychiatric: He has a normal mood and affect. His behavior is normal.          Assessment & Plan:  Sinusitis- Gave amoxicillin. Instructed to use nasal saline spray to moisturize nostril to help with nosebleeds. Considered nasal steroid spray but he reported he and tried them in the past and they had not done him any good. If not improving by Friday call office and I will also prescribe a steroid taper.

## 2011-07-07 NOTE — Patient Instructions (Addendum)
Start Amoxicillin. If not better in next 48 hours then call and will prescribe prednisone for head congestion. Consider nasal saline to mosturize nostrils.

## 2011-07-25 ENCOUNTER — Telehealth: Payer: Self-pay | Admitting: Family Medicine

## 2011-07-25 NOTE — Telephone Encounter (Signed)
Patient called advised that he has an appointment Tues and would like to have lab order sent downstairs Monday 07/28/11 so his results can be back by his appointment Tues

## 2011-07-28 ENCOUNTER — Other Ambulatory Visit: Payer: Self-pay | Admitting: *Deleted

## 2011-07-28 LAB — COMPREHENSIVE METABOLIC PANEL
ALT: 55 U/L — ABNORMAL HIGH (ref 0–53)
AST: 32 U/L (ref 0–37)
Albumin: 4.7 g/dL (ref 3.5–5.2)
Alkaline Phosphatase: 46 U/L (ref 39–117)
BUN: 17 mg/dL (ref 6–23)
Potassium: 4.5 mEq/L (ref 3.5–5.3)

## 2011-07-28 NOTE — Telephone Encounter (Signed)
Repeat CMP and Test.

## 2011-07-29 ENCOUNTER — Encounter: Payer: Self-pay | Admitting: Family Medicine

## 2011-07-29 ENCOUNTER — Ambulatory Visit (INDEPENDENT_AMBULATORY_CARE_PROVIDER_SITE_OTHER): Payer: Managed Care, Other (non HMO) | Admitting: Family Medicine

## 2011-07-29 DIAGNOSIS — E785 Hyperlipidemia, unspecified: Secondary | ICD-10-CM

## 2011-07-29 DIAGNOSIS — E349 Endocrine disorder, unspecified: Secondary | ICD-10-CM

## 2011-07-29 DIAGNOSIS — R030 Elevated blood-pressure reading, without diagnosis of hypertension: Secondary | ICD-10-CM

## 2011-07-29 DIAGNOSIS — E291 Testicular hypofunction: Secondary | ICD-10-CM

## 2011-07-29 DIAGNOSIS — N529 Male erectile dysfunction, unspecified: Secondary | ICD-10-CM

## 2011-07-29 LAB — TESTOSTERONE, FREE, TOTAL, SHBG
Testosterone, Free: 155.7 pg/mL (ref 47.0–244.0)
Testosterone-% Free: 2.6 % (ref 1.6–2.9)

## 2011-07-29 MED ORDER — TESTOSTERONE 30 MG/ACT TD SOLN: 30.0000 mg | TRANSDERMAL | Status: DC

## 2011-07-29 MED ORDER — ROSUVASTATIN CALCIUM 20 MG PO TABS: 20.0000 mg | ORAL_TABLET | Freq: Every day | ORAL | Status: DC

## 2011-07-29 NOTE — Progress Notes (Signed)
Subjective:     Patient ID: Shawn Richards, male   DOB: 08/05/1975, 36 y.o.   MRN: [id number]  HPI Patient reports feeling much better since being on the testosterone replacement. He reports his libido has been restored to normal and energy levels have been restored. He reports no problems with the testosterone replacement. Hyperlipidemia he is still on his Crestor without any problems  Review of Systems Essentially unremarkable.     Filed Vitals:   07/29/11 0834  BP: 126/73  Pulse: 61   Objective:   Physical Exam Well-nourished well-developed white male no acute distress Lungs were clear to auscultation and percussion cardiovascular S1 and S2 blood pressure is under much better control today labs reviewed showing testosterone going from below normal to about mid range of 587.67    Results for orders placed in visit on 07/28/11  COMPREHENSIVE METABOLIC PANEL      Component Value Range   Sodium 141  135 - 145 (mEq/L)   Potassium 4.5  3.5 - 5.3 (mEq/L)   Chloride 104  96 - 112 (mEq/L)   CO2 27  19 - 32 (mEq/L)   Glucose, Bld 94  70 - 99 (mg/dL)   BUN 17  6 - 23 (mg/dL)   Creat 4.09  8.11 - 9.14 (mg/dL)   Total Bilirubin 1.5 (*) 0.3 - 1.2 (mg/dL)   Alkaline Phosphatase 46  39 - 117 (U/L)   AST 32  0 - 37 (U/L)   ALT 55 (*) 0 - 53 (U/L)   Total Protein 6.6  6.0 - 8.3 (g/dL)   Albumin 4.7  3.5 - 5.2 (g/dL)   Calcium 78.2  8.4 - 10.5 (mg/dL)  TESTOSTERONE, FREE, TOTAL      Component Value Range   Testosterone 587.67  250 - 890 (ng/dL)   Sex Hormone Binding       Testosterone, Free       Testosterone-% Freee.       Assessment:     #1 erectile dysfunction and fatigue secondary to a testosterone now under good control #2 elevated blood pressure without diagnosis of hypertension #3 hyperlipidemia    Plan:     #1 Will continue his Axiron daily. He could try taking it 5 days a week and see how he does if he does okay fine but if he needs to increase to go back to 7 days a  week 5.  #2 elevated blood pressure under good control now #3 hyperlipidemia continue Crestor Return in 6 months for physical examination.

## 2011-07-29 NOTE — Patient Instructions (Signed)
Return in 6 months. Call for labs for testosterone, lipid, Aic, CBC,TSH and CMP a few days before your PE.May reduce Axiron to 5 X a week .

## 2011-11-27 ENCOUNTER — Ambulatory Visit
Admission: RE | Admit: 2011-11-27 | Discharge: 2011-11-27 | Disposition: A | Discharge status: Home / Self Care | Payer: Managed Care, Other (non HMO) | Source: Ambulatory Visit | Attending: Family Medicine | Admitting: Family Medicine

## 2011-11-27 ENCOUNTER — Ambulatory Visit (INDEPENDENT_AMBULATORY_CARE_PROVIDER_SITE_OTHER): Payer: Managed Care, Other (non HMO) | Admitting: Family Medicine

## 2011-11-27 ENCOUNTER — Encounter: Payer: Self-pay | Admitting: Family Medicine

## 2011-11-27 VITALS — BP 134/83 | HR 56 | Ht 75.0 in | Wt 263.0 lb

## 2011-11-27 DIAGNOSIS — M25572 Pain in left ankle and joints of left foot: Secondary | ICD-10-CM

## 2011-11-27 DIAGNOSIS — M79672 Pain in left foot: Secondary | ICD-10-CM

## 2011-11-27 DIAGNOSIS — M109 Gout, unspecified: Secondary | ICD-10-CM

## 2011-11-27 DIAGNOSIS — M25579 Pain in unspecified ankle and joints of unspecified foot: Secondary | ICD-10-CM

## 2011-11-27 DIAGNOSIS — M79609 Pain in unspecified limb: Secondary | ICD-10-CM

## 2011-11-27 MED ORDER — COLCHICINE 0.6 MG PO TABS: ORAL_TABLET | ORAL | Status: DC

## 2011-11-27 MED ORDER — INDOMETHACIN 50 MG PO CAPS: 50.0000 mg | ORAL_CAPSULE | Freq: Three times a day (TID) | ORAL | Status: AC

## 2011-11-27 NOTE — Progress Notes (Signed)
  Subjective:    Patient ID: Shawn Richards, male    DOB: Jul 09, 1975, 36 y.o.   MRN: [id number]  Ankle Pain  The incident occurred 5 to 7 days ago (started last Thursday AM). The incident occurred at home. There was no injury mechanism. The pain is present in the left ankle. The quality of the pain is described as stabbing and aching (He states the first day or 2 it was am bad stabbing pain he could barely walk he he iced it and now it does feel somewhat better he's asked him to walk but he still has some pain and still notices swelling of the left foot). The pain is moderate. The pain has been improving since onset. Associated symptoms include an inability to bear weight and muscle weakness. Pertinent negatives include no loss of motion, loss of sensation, numbness or tingling. He reports no foreign bodies present. The symptoms are aggravated by movement and weight bearing (Initially aggravated by weightbearing that has gotten better). He has tried elevation, ice, immobilization and non-weight bearing for the symptoms. The treatment provided mild relief.    Review of Systems  Musculoskeletal: Positive for myalgias, joint swelling and gait problem.  Neurological: Negative for tingling and numbness.  All other systems reviewed and are negative.     BP 134/83  Pulse 56  Ht 6\' 3"  (1.905 m)  Wt 263 lb (119.296 kg)  BMI 32.87 kg/m2  SpO2 98% Objective:   Physical Exam  Constitutional: He is oriented to person, place, and time. He appears well-developed and well-nourished.  Musculoskeletal: He exhibits edema and tenderness.       Left ankle and foot near the is in step is swollen tender to touch and warm to touch as well.  Neurological: He is alert and oriented to person, place, and time. He has normal reflexes.  Skin: Skin is warm and dry. Rash noted. There is erythema.     Assessment & Plan:   Left ankle and foot pain without trauma more than likely first episode of gout. Will x-ray the  left foot NyQuil to make sure there is no fracture. Discussed with him the pathophysiology of gout will check a uric acid if this continues to recur and we comes back in August and September have blood work we'll try to remember to add a uric acid that time. In the meantime we'll place on colchicine 0.6 mg one tablet once to  twice a day and Indocin 50 mg one capsule one to 3 times a day with food. Return if symptoms get worse.

## 2011-11-27 NOTE — Patient Instructions (Signed)
Gout Gout is an inflammatory condition (arthritis) caused by a buildup of uric acid crystals in the joints. Uric acid is a chemical that is normally present in the blood. Under some circumstances, uric acid can form into crystals in your joints. This causes joint redness, soreness, and swelling (inflammation). Repeat attacks are common. Over time, uric acid crystals can form into masses (tophi) near a joint, causing disfigurement. Gout is treatable and often preventable. CAUSES  The disease begins with elevated levels of uric acid in the blood. Uric acid is produced by your body when it breaks down a naturally found substance called purines. This also happens when you eat certain foods such as meats and fish. Causes of an elevated uric acid level include:  Being passed down from parent to child (heredity).   Diseases that cause increased uric acid production (obesity, psoriasis, some cancers).   Excessive alcohol use.   Diet, especially diets rich in meat and seafood.   Medicines, including certain cancer-fighting drugs (chemotherapy), diuretics, and aspirin.   Chronic kidney disease. The kidneys are no longer able to remove uric acid well.   Problems with metabolism.  Conditions strongly associated with gout include:  Obesity.   High blood pressure.   High cholesterol.   Diabetes.  Not everyone with elevated uric acid levels gets gout. It is not understood why some people get gout and others do not. Surgery, joint injury, and eating too much of certain foods are some of the factors that can lead to gout. SYMPTOMS   An attack of gout comes on quickly. It causes intense pain with redness, swelling, and warmth in a joint.   Fever can occur.   Often, only one joint is involved. Certain joints are more commonly involved:   Base of the big toe.   Knee.   Ankle.   Wrist.   Finger.  Without treatment, an attack usually goes away in a few days to weeks. Between attacks, you  usually will not have symptoms, which is different from many other forms of arthritis. DIAGNOSIS  Your caregiver will suspect gout based on your symptoms and exam. Removal of fluid from the joint (arthrocentesis) is done to check for uric acid crystals. Your caregiver will give you a medicine that numbs the area (local anesthetic) and use a needle to remove joint fluid for exam. Gout is confirmed when uric acid crystals are seen in joint fluid, using a special microscope. Sometimes, blood, urine, and X-ray tests are also used. TREATMENT  There are 2 phases to gout treatment: treating the sudden onset (acute) attack and preventing attacks (prophylaxis). Treatment of an Acute Attack  Medicines are used. These include anti-inflammatory medicines or steroid medicines.   An injection of steroid medicine into the affected joint is sometimes necessary.   The painful joint is rested. Movement can worsen the arthritis.   You may use warm or cold treatments on painful joints, depending which works best for you.   Discuss the use of coffee, vitamin C, or cherries with your caregiver. These may be helpful treatment options.  Treatment to Prevent Attacks After the acute attack subsides, your caregiver may advise prophylactic medicine. These medicines either help your kidneys eliminate uric acid from your body or decrease your uric acid production. You may need to stay on these medicines for a very long time. The early phase of treatment with prophylactic medicine can be associated with an increase in acute gout attacks. For this reason, during the first few months   of treatment, your caregiver may also advise you to take medicines usually used for acute gout treatment. Be sure you understand your caregiver's directions. You should also discuss dietary treatment with your caregiver. Certain foods such as meats and fish can increase uric acid levels. Other foods such as dairy can decrease levels. Your caregiver  can give you a list of foods to avoid. HOME CARE INSTRUCTIONS   Do not take aspirin to relieve pain. This raises uric acid levels.   Only take over-the-counter or prescription medicines for pain, discomfort, or fever as directed by your caregiver.   Rest the joint as much as possible. When in bed, keep sheets and blankets off painful areas.   Keep the affected joint raised (elevated).   Use crutches if the painful joint is in your leg.   Drink enough water and fluids to keep your urine clear or pale yellow. This helps your body get rid of uric acid. Do not drink alcoholic beverages. They slow the passage of uric acid.   Follow your caregiver's dietary instructions. Pay careful attention to the amount of protein you eat. Your daily diet should emphasize fruits, vegetables, whole grains, and fat-free or low-fat milk products.   Maintain a healthy body weight.  SEEK MEDICAL CARE IF:   You have an oral temperature above 102 F (38.9 C).   You develop diarrhea, vomiting, or any side effects from medicines.   You do not feel better in 24 hours, or you are getting worse.  SEEK IMMEDIATE MEDICAL CARE IF:   Your joint becomes suddenly more tender and you have:   Chills.   An oral temperature above 102 F (38.9 C), not controlled by medicine.  MAKE SURE YOU:   Understand these instructions.   Will watch your condition.   Will get help right away if you are not doing well or get worse.  Document Released: 06/06/2000 Document Revised: 05/29/2011 Document Reviewed: 09/17/2009 [employer] Patient Information 2012 [employer]. 

## 2012-01-27 ENCOUNTER — Encounter: Payer: Self-pay | Admitting: Family Medicine

## 2012-01-27 ENCOUNTER — Ambulatory Visit (INDEPENDENT_AMBULATORY_CARE_PROVIDER_SITE_OTHER): Payer: Managed Care, Other (non HMO) | Admitting: Family Medicine

## 2012-01-27 VITALS — BP 138/80 | HR 54 | Ht 75.0 in | Wt 263.0 lb

## 2012-01-27 DIAGNOSIS — E349 Endocrine disorder, unspecified: Secondary | ICD-10-CM

## 2012-01-27 DIAGNOSIS — E785 Hyperlipidemia, unspecified: Secondary | ICD-10-CM

## 2012-01-27 DIAGNOSIS — M2550 Pain in unspecified joint: Secondary | ICD-10-CM

## 2012-01-27 DIAGNOSIS — I498 Other specified cardiac arrhythmias: Secondary | ICD-10-CM

## 2012-01-27 DIAGNOSIS — N529 Male erectile dysfunction, unspecified: Secondary | ICD-10-CM

## 2012-01-27 DIAGNOSIS — Z1211 Encounter for screening for malignant neoplasm of colon: Secondary | ICD-10-CM

## 2012-01-27 DIAGNOSIS — R001 Bradycardia, unspecified: Secondary | ICD-10-CM

## 2012-01-27 DIAGNOSIS — R030 Elevated blood-pressure reading, without diagnosis of hypertension: Secondary | ICD-10-CM

## 2012-01-27 DIAGNOSIS — Z0000 Encounter for general adult medical examination without abnormal findings: Secondary | ICD-10-CM

## 2012-01-27 DIAGNOSIS — R5383 Other fatigue: Secondary | ICD-10-CM

## 2012-01-27 MED ORDER — TESTOSTERONE 30 MG/ACT TD SOLN: 30.0000 mg | TRANSDERMAL | Status: DC

## 2012-01-27 MED ORDER — ROSUVASTATIN CALCIUM 20 MG PO TABS: 10.0000 mg | ORAL_TABLET | Freq: Every day | ORAL | Status: DC

## 2012-01-27 NOTE — Patient Instructions (Signed)

## 2012-01-27 NOTE — Progress Notes (Signed)
Subjective:    Patient ID: Shawn Richards, male    DOB: June 22, 1976, 36 y.o.   MRN: [id number]  HPI Patient is here for yearly exam.  Review of Systems  Constitutional: Positive for fatigue.  Musculoskeletal: Positive for myalgias, joint swelling and arthralgias.   No Known Allergies History   Social History  . Marital Status: Married    Spouse Name: N/A    Number of Children: N/A  . Years of Education: N/A   Occupational History  . Not on file.   Social History Main Topics  . Smoking status: Never Smoker   . Smokeless tobacco: Never Used  . Alcohol Use: 1.0 oz/week    2 drink(s) per week     occasional  . Drug Use:   . Sexually Active: Yes    Birth Control/ Protection: Surgical   Other Topics Concern  . Not on file   Social History Narrative  . No narrative on file   Family History  Problem Relation Age of Onset  . Cancer Father     prostate  . Hyperlipidemia Father   . Hyperlipidemia Mother    Past Medical History  Diagnosis Date  . Hypertension 2006    treated for only 6 months  . Hyperlipidemia   . Fatty liver   . Hypotestosteronism    Past Surgical History  Procedure Date  . Tympanostomy tube placement   . Sinus polyps 1995      BP 138/80  Pulse 54  Ht 6\' 3"  (1.905 m)  Wt 263 lb (119.296 kg)  BMI 32.87 kg/m2  SpO2 98% Objective:   Physical Exam  Vitals reviewed. Constitutional: He is oriented to person, place, and time. He appears well-developed and well-nourished.  HENT:  Head: Normocephalic and atraumatic.  Right Ear: External ear normal.  Left Ear: External ear normal.  Mouth/Throat: Oropharynx is clear and moist.  Eyes: Pupils are equal, round, and reactive to light. Right eye exhibits no discharge and no exudate. No foreign body present in the right eye. Left eye exhibits no discharge and no exudate. No foreign body present in the left eye. Right conjunctiva is not injected. Left conjunctiva is not injected.  Fundoscopic exam:     The right eye shows no arteriolar narrowing, no AV nicking and no exudate.       The left eye shows no arteriolar narrowing, no AV nicking and no exudate.  Neck: Normal range of motion. Neck supple. No thyromegaly present.  Cardiovascular: Normal rate, regular rhythm and normal heart sounds.  Exam reveals no friction rub.   No murmur heard. Pulmonary/Chest: Effort normal and breath sounds normal. No respiratory distress. He has no wheezes.  Abdominal: Soft. Bowel sounds are normal. He exhibits no distension. There is no tenderness. Hernia confirmed negative in the right inguinal area and confirmed negative in the left inguinal area.  Genitourinary: Rectum normal and prostate normal. Rectal exam shows no mass, no tenderness and anal tone normal. Guaiac negative stool. Right testis shows no mass and no tenderness. Left testis shows no mass and no tenderness. Circumcised. No penile erythema or penile tenderness. No discharge found.  Musculoskeletal: Normal range of motion. He exhibits no edema and no tenderness.  Lymphadenopathy:       Right: No inguinal adenopathy present.       Left: No inguinal adenopathy present.  Neurological: He is alert and oriented to person, place, and time. He has normal reflexes. No cranial nerve deficit. Coordination normal.  Skin: Skin  is warm and dry. He is not diaphoretic. No erythema.  Psychiatric: He has a normal mood and affect. His behavior is normal.     Results for orders placed in visit on 07/28/11  COMPREHENSIVE METABOLIC PANEL      Component Value Range   Sodium 141  135 - 145 mEq/L   Potassium 4.5  3.5 - 5.3 mEq/L   Chloride 104  96 - 112 mEq/L   CO2 27  19 - 32 mEq/L   Glucose, Bld 94  70 - 99 mg/dL   BUN 17  6 - 23 mg/dL   Creat 1.61  0.96 - 0.45 mg/dL   Total Bilirubin 1.5 (*) 0.3 - 1.2 mg/dL   Alkaline Phosphatase 46  39 - 117 U/L   AST 32  0 - 37 U/L   ALT 55 (*) 0 - 53 U/L   Total Protein 6.6  6.0 - 8.3 g/dL   Albumin 4.7  3.5 - 5.2  g/dL   Calcium 40.9  8.4 - 81.1 mg/dL  TESTOSTERONE, FREE, TOTAL      Component Value Range   Testosterone 587.67  250 - 890 ng/dL   Sex Hormone Binding 22  13 - 71 nmol/L   Testosterone, Free 155.7  47.0 - 244.0 pg/mL   Testosterone-% Freee. 2.6  1.6 - 2.9 %   EKG bradycardia is present at 48    Assessment & Plan:  #1 health maintenance will obtain routine labs. #2 hypotestosteronism will obtain a testosterone level  #3 fatigue we'll check. We'll check a B12 and TSH level.  will return in 6 months for followup

## 2012-01-30 LAB — LIPID PANEL
HDL: 35 mg/dL — ABNORMAL LOW (ref >39)
Triglycerides: 97 mg/dL (ref <150)

## 2012-01-30 LAB — CBC WITH DIFFERENTIAL/PLATELET
HCT: 43.1 % (ref 39.0–52.0)
Hemoglobin: 14.8 g/dL (ref 13.0–17.0)
Lymphs Abs: 1.6 10*3/uL (ref 0.7–4.0)
MCH: 29.7 pg (ref 26.0–34.0)
Monocytes Relative: 9 % (ref 3–12)
Neutro Abs: 2.5 10*3/uL (ref 1.7–7.7)
Neutrophils Relative %: 47 % (ref 43–77)
RBC: 4.99 MIL/uL (ref 4.22–5.81)

## 2012-01-30 LAB — COMPLETE METABOLIC PANEL WITH GFR
Albumin: 4.4 g/dL (ref 3.5–5.2)
CO2: 28 mEq/L (ref 19–32)
Calcium: 9.8 mg/dL (ref 8.4–10.5)
GFR, Est African American: >89 mL/min
GFR, Est Non African American: >89 mL/min
Glucose, Bld: 87 mg/dL (ref 70–99)
Potassium: 4.6 mEq/L (ref 3.5–5.3)
Sodium: 143 mEq/L (ref 135–145)
Total Protein: 6.7 g/dL (ref 6.0–8.3)

## 2012-02-02 LAB — TESTOSTERONE, FREE, TOTAL, SHBG
Testosterone, Free: 35.5 pg/mL — ABNORMAL LOW (ref 47.0–244.0)
Testosterone-% Free: 2.4 % (ref 1.6–2.9)

## 2012-02-07 ENCOUNTER — Encounter: Payer: Self-pay | Admitting: Family Medicine

## 2012-02-12 ENCOUNTER — Telehealth: Payer: Self-pay | Admitting: *Deleted

## 2012-02-12 DIAGNOSIS — Z0000 Encounter for general adult medical examination without abnormal findings: Secondary | ICD-10-CM

## 2012-05-12 ENCOUNTER — Other Ambulatory Visit: Payer: Self-pay | Admitting: Family Medicine

## 2012-12-01 ENCOUNTER — Ambulatory Visit (INDEPENDENT_AMBULATORY_CARE_PROVIDER_SITE_OTHER): Payer: Managed Care, Other (non HMO) | Admitting: Family Medicine

## 2012-12-01 ENCOUNTER — Encounter: Payer: Self-pay | Admitting: Family Medicine

## 2012-12-01 VITALS — BP 126/76 | HR 58 | Temp 98.1°F | Wt 259.0 lb

## 2012-12-01 DIAGNOSIS — R05 Cough: Secondary | ICD-10-CM

## 2012-12-01 DIAGNOSIS — R059 Cough, unspecified: Secondary | ICD-10-CM

## 2012-12-01 MED ORDER — BECLOMETHASONE DIPROPIONATE 80 MCG/ACT NA AERS: 2.0000 | INHALATION_SPRAY | Freq: Every day | NASAL | Status: DC

## 2012-12-01 NOTE — Progress Notes (Signed)
CC: Shawn Richards is a 37 y.o. male is here for cough x 1 month   Subjective: HPI:  Complains of cough present for 4 weeks moderate severity at onset when he was experiencing chills, subjective fevers, nasal congestion. This all resolved without intervention within 5 days however nonproductive mild cough persists. It is present on every weekday typically absent on the weekends. It is worse the longer he stays at work. Is described as annoying nothing particularly makes it better or worse other than above. It is not associated with exertion. He denies nocturnal symptoms. He's never had this before. He denies fevers, chills, nasal congestion, sore throat, chest pain, blood in sputum, productive cough, chest pain, back pain, GI disturbance, nausea, headache, shortness of breath nor orthopnea. He is not and has never been a smoker   Review Of Systems Outlined In HPI  Past Medical History  Diagnosis Date  . Hypertension 2006    treated for only 6 months  . Hyperlipidemia   . Fatty liver   . Hypotestosteronism      Family History  Problem Relation Age of Onset  . Cancer Father     prostate  . Hyperlipidemia Father   . Hyperlipidemia Mother      History  Substance Use Topics  . Smoking status: Never Smoker   . Smokeless tobacco: Never Used  . Alcohol Use: 1.0 oz/week    2 drink(s) per week     Comment: occasional     Objective: Filed Vitals:   12/01/12 1323  BP: 126/76  Pulse: 58  Temp: 98.1 F (36.7 C)    General: Alert and Oriented, No Acute Distress HEENT: Pupils equal, round, reactive to light. Conjunctivae clear.  External ears unremarkable, canals clear with intact TMs with appropriate landmarks.  Middle ear appears open without effusion. Pink inferior turbinates.  Moist mucous membranes, pharynx without inflammation nor lesions moderate cobblestoning.  Neck supple without palpable lymphadenopathy nor abnormal masses. Lungs: Clear to auscultation bilaterally, no  wheezing/ronchi/rales.  Comfortable work of breathing. Good air movement. Cardiac: Regular rate and rhythm. Normal S1/S2.  No murmurs, rubs, nor gallops.   Extremities: No peripheral edema.  Strong peripheral pulses.  Mental Status: No depression, anxiety, nor agitation. Skin: Warm and dry.  Assessment & Plan: Shawn Richards was seen today for cough x 1 month.  Diagnoses and associated orders for this visit:  Cough - Beclomethasone Dipropionate 80 MCG/ACT AERS; Place 2 Act into the nose daily.    Discussed with patient that exam would be most consistent with postnasal drip, it often can take up to 4 weeks for post viral cough to resolve. Start sample of qnasl if not improved in 2 weeks or worsening will require chest x-ray. Overall exam reassuring today  Return if symptoms worsen or fail to improve.

## 2013-03-01 ENCOUNTER — Ambulatory Visit (HOSPITAL_BASED_OUTPATIENT_CLINIC_OR_DEPARTMENT_OTHER)
Admission: RE | Admit: 2013-03-01 | Discharge: 2013-03-01 | Disposition: A | Discharge status: Home / Self Care | Payer: Managed Care, Other (non HMO) | Source: Ambulatory Visit | Attending: Family Medicine | Admitting: Family Medicine

## 2013-03-01 ENCOUNTER — Encounter: Payer: Self-pay | Admitting: Family Medicine

## 2013-03-01 ENCOUNTER — Telehealth: Payer: Self-pay | Admitting: *Deleted

## 2013-03-01 ENCOUNTER — Ambulatory Visit (INDEPENDENT_AMBULATORY_CARE_PROVIDER_SITE_OTHER): Payer: Managed Care, Other (non HMO) | Admitting: Family Medicine

## 2013-03-01 VITALS — BP 158/87 | HR 61 | Temp 98.2°F | Wt 258.0 lb

## 2013-03-01 DIAGNOSIS — R319 Hematuria, unspecified: Secondary | ICD-10-CM

## 2013-03-01 DIAGNOSIS — R3129 Other microscopic hematuria: Secondary | ICD-10-CM | POA: Insufficient documentation

## 2013-03-01 DIAGNOSIS — I700 Atherosclerosis of aorta: Secondary | ICD-10-CM | POA: Insufficient documentation

## 2013-03-01 DIAGNOSIS — R109 Unspecified abdominal pain: Secondary | ICD-10-CM

## 2013-03-01 DIAGNOSIS — N200 Calculus of kidney: Secondary | ICD-10-CM

## 2013-03-01 DIAGNOSIS — N1330 Unspecified hydronephrosis: Secondary | ICD-10-CM | POA: Insufficient documentation

## 2013-03-01 DIAGNOSIS — R112 Nausea with vomiting, unspecified: Secondary | ICD-10-CM | POA: Insufficient documentation

## 2013-03-01 DIAGNOSIS — N201 Calculus of ureter: Secondary | ICD-10-CM | POA: Insufficient documentation

## 2013-03-01 LAB — POCT URINALYSIS DIPSTICK
Bilirubin, UA: NEGATIVE
Leukocytes, UA: NEGATIVE

## 2013-03-01 MED ORDER — TAMSULOSIN HCL 0.4 MG PO CAPS: 0.4000 mg | ORAL_CAPSULE | Freq: Every day | ORAL | Status: DC

## 2013-03-01 MED ORDER — HYDROCODONE-ACETAMINOPHEN 5-325 MG PO TABS: 1.0000 | ORAL_TABLET | Freq: Four times a day (QID) | ORAL | Status: DC | PRN

## 2013-03-01 MED ORDER — ONDANSETRON HCL 4 MG PO TABS
8.0000 mg | ORAL_TABLET | Freq: Once | ORAL | Status: AC
  Administered 2013-03-01: 8 mg via ORAL

## 2013-03-01 MED ORDER — KETOROLAC TROMETHAMINE 60 MG/2ML IM SOLN
60.0000 mg | Freq: Once | INTRAMUSCULAR | Status: AC
  Administered 2013-03-01: 60 mg via INTRAMUSCULAR

## 2013-03-01 NOTE — Telephone Encounter (Signed)
Wife states pt may have passed kidney stones this am and is having some pain and nausea and vomiting. She would like to know if he should come in or is there something he can get from [facility].  Meyer Cory, LPN

## 2013-03-01 NOTE — Telephone Encounter (Signed)
It would be best for him to come in to be evaluated to get urine studies, determine if he can tolerate oral medications with anti-emetics, and to determine if we need to get a CT scan.

## 2013-03-01 NOTE — Telephone Encounter (Signed)
[employer] and no precert is required

## 2013-03-01 NOTE — Telephone Encounter (Signed)
Wife states she can have him at 10:30am. Pt placed on schedule to be seen.  Meyer Cory, LPN

## 2013-03-01 NOTE — Addendum Note (Signed)
Addended by: Wyline Beady on: 03/01/2013 03:39 PM   Modules accepted: Orders

## 2013-03-01 NOTE — Progress Notes (Signed)
CC: Shawn Richards is a 37 y.o. male is here for possible kidney stones   Subjective: HPI:  Complains of acute onset severe right flank pain which began late last night in the first hours nothing seemed to make better or worse. It was accompanied by subjective chills and sweats along with vomiting which did not contain coffee ground appearance. Around midnight last night symptoms subsided to mild severity with radiation of pain into right upper quadrant. This morning he woke with moderate severity of right flank pain continuing radiation to right lower quadrant, he has vomited once this morning continues to have nausea of mild severity and occasional bouts of sweats. Discomfort is worse with long periods of bending over improvement with sitting erect. States approximately 6 months ago was diagnosed with a kidney stone on the right side which he believes he passed. He denies fevers, shortness of breath, cough, abdominal pain, chest pain, diarrhea, constipation, blood in urine, urinary frequency, nor testicular pain or penile discharge. Interventions have included increased oral intake of fluids   Review Of Systems Outlined In HPI  Past Medical History  Diagnosis Date  . Hypertension 2006    treated for only 6 months  . Hyperlipidemia   . Fatty liver   . Hypotestosteronism      Family History  Problem Relation Age of Onset  . Cancer Father     prostate  . Hyperlipidemia Father   . Hyperlipidemia Mother      History  Substance Use Topics  . Smoking status: Never Smoker   . Smokeless tobacco: Never Used  . Alcohol Use: 1.0 oz/week    2 drink(s) per week     Comment: occasional     Objective: Filed Vitals:   03/01/13 1038  BP: 158/87  Pulse: 61  Temp: 98.2 F (36.8 C)    General: Alert and Oriented, No Acute Distress HEENT: Pupils equal, round, reactive to light. Conjunctivae clear. Moist mucous membranes pharynx unremarkable Lungs: Clear to auscultation bilaterally, no  wheezing/ronchi/rales.  Comfortable work of breathing. Good air movement. Cardiac: Regular rate and rhythm. Normal S1/S2.  No murmurs, rubs, nor gallops.   Abdomen: Mild right lower quadrant tenderness to deep palpation without rebound no palpable masses or pain elsewhere in the abdomen. Bowel sounds are normal. Psoas sign negative on the right no pain with heel strike. Back: No CVA tenderness, flank pain is not reproduced with palpation of the right flank Extremities: No peripheral edema.  Strong peripheral pulses.  Mental Status: No depression, anxiety, nor agitation. Skin: Warm and dry.  Assessment & Plan: Dakin was seen today for possible kidney stones.  Diagnoses and associated orders for this visit:  Nausea with vomiting - ondansetron (ZOFRAN) tablet 8 mg; Take 2 tablets (8 mg total) by mouth once.  Right flank pain - Urinalysis Dipstick - CT Abdomen Pelvis Wo Contrast; Future - HYDROcodone-acetaminophen (NORCO/VICODIN) 5-325 MG per tablet; Take 1-2 tablets by mouth every 6 (six) hours as needed for pain. - tamsulosin (FLOMAX) 0.4 MG CAPS capsule; Take 1 capsule (0.4 mg total) by mouth daily. To help with kidney stone passage. - ketorolac (TORADOL) injection 60 mg; Inject 2 mLs (60 mg total) into the muscle once.  Hematuria - CT Abdomen Pelvis Wo Contrast; Future - HYDROcodone-acetaminophen (NORCO/VICODIN) 5-325 MG per tablet; Take 1-2 tablets by mouth every 6 (six) hours as needed for pain. - tamsulosin (FLOMAX) 0.4 MG CAPS capsule; Take 1 capsule (0.4 mg total) by mouth daily. To help with kidney stone  passage.  Nephrolithiasis    Urinalysis dipstick with moderate blood, symptoms are quite suspicious for nephrolithiasis, CT scan was obtained to determine the size of possible nephrolithiasis showing 3 mm stone at uteropelvic junction with moderate hydronephrosis. He received Toradol and Zofran here in the office, the next 48 hours he will take Vicodin as needed, Flomax daily,  he declines nausea medication, he was encouraged to stay well hydrated to help passage of stone. Signs and symptoms requring emergent/urgent reevaluation were discussed with the patient. Followup with me Thursday at least with a phone call if not significantly improved as he would warrant urology referral. Low suspicion of UTI complicating above based on UA and clinical picture.  Return in about 2 days (around 03/03/2013).

## 2013-03-02 ENCOUNTER — Telehealth: Payer: Self-pay | Admitting: *Deleted

## 2013-03-02 DIAGNOSIS — N200 Calculus of kidney: Secondary | ICD-10-CM

## 2013-03-02 MED ORDER — OXYCODONE-ACETAMINOPHEN 10-325 MG PO TABS: 1.0000 | ORAL_TABLET | Freq: Four times a day (QID) | ORAL | Status: DC | PRN

## 2013-03-02 MED ORDER — ONDANSETRON HCL 8 MG PO TABS: 8.0000 mg | ORAL_TABLET | Freq: Three times a day (TID) | ORAL | Status: DC | PRN

## 2013-03-02 NOTE — Telephone Encounter (Signed)
Sue Lush or [place], Can you please clarify if he's actually taking reglan I don't think this was prescribed.  I'd encourage him to continue tamsulosin/flomax and I'll print off an Rx for oxycodone that he can use which is stronger than hydrocodone for pain.  Also, an Rx of zofran for nausea will be included.   Placed in andrea's inbox

## 2013-03-02 NOTE — Telephone Encounter (Signed)
Pt's wife has called stating that pt has taken the reglan and the Vicodin. States vicodin has only taken the edge off and that pt's symptoms are back. She wants to know what you suggest or can he take Ibuprofen with the other meds.  Meyer Cory, LPN

## 2013-03-03 LAB — URINE CULTURE
Colony Count: NO GROWTH
Organism ID, Bacteria: NO GROWTH

## 2013-03-03 NOTE — Addendum Note (Signed)
Addended by: Laren Boom on: 03/03/2013 08:00 AM   Modules accepted: Orders

## 2013-03-03 NOTE — Telephone Encounter (Signed)
Gave Tammy the referral for urology and she will let me know if she can't get him in somewhere today.  Meyer Cory, LPN

## 2013-03-03 NOTE — Telephone Encounter (Signed)
She stated reglan on the message but she states this am that he is taking Flomax. She also says that his pain has been coming and going but this am since 5:30 he has been in extreme pain. She stated he had not had BM in a few days. Told to get Senokot or Dulcolax. She asked about taking Ibuprofen with pain med but I informed to only take pain med and if still having pain issues after taking Oxycodone then he may need to consider coming in again.  Meyer Cory, LPN

## 2013-03-03 NOTE — Telephone Encounter (Signed)
I spoke with [name] at literally the same time wife was talking to [name].  He sounds quite uncomfortable, she's' going to pick up oxycodone and I'm going to try and get him a urgent urology referral today, if not able to be seen today will order ultrasound to follow right hydronephrosis.

## 2013-03-04 ENCOUNTER — Telehealth: Payer: Self-pay | Admitting: Family Medicine

## 2013-03-04 DIAGNOSIS — N200 Calculus of kidney: Secondary | ICD-10-CM

## 2013-03-04 NOTE — Telephone Encounter (Signed)
Opened to check care everywhere

## 2013-05-25 ENCOUNTER — Telehealth: Payer: Self-pay | Admitting: Family Medicine

## 2013-05-25 MED ORDER — ROSUVASTATIN CALCIUM 20 MG PO TABS: ORAL_TABLET | ORAL | Status: DC

## 2013-05-25 NOTE — Telephone Encounter (Signed)
Needs f/u.

## 2013-05-30 ENCOUNTER — Telehealth: Payer: Self-pay | Admitting: *Deleted

## 2013-05-30 DIAGNOSIS — E785 Hyperlipidemia, unspecified: Secondary | ICD-10-CM

## 2013-05-30 NOTE — Telephone Encounter (Signed)
Pt called and states he has an appt on Friday and would like to have labs drawn today. From what I can tell he needs labs for chol.

## 2013-05-31 LAB — LIPID PANEL
HDL: 38 mg/dL — ABNORMAL LOW (ref >39)
LDL Cholesterol: 96 mg/dL (ref 0–99)
Total CHOL/HDL Ratio: 4 Ratio
Triglycerides: 96 mg/dL (ref <150)
VLDL: 19 mg/dL (ref 0–40)

## 2013-06-03 ENCOUNTER — Ambulatory Visit (INDEPENDENT_AMBULATORY_CARE_PROVIDER_SITE_OTHER): Payer: Managed Care, Other (non HMO) | Admitting: Family Medicine

## 2013-06-03 ENCOUNTER — Encounter: Payer: Self-pay | Admitting: Family Medicine

## 2013-06-03 VITALS — BP 132/76 | HR 58 | Wt 268.0 lb

## 2013-06-03 DIAGNOSIS — R030 Elevated blood-pressure reading, without diagnosis of hypertension: Secondary | ICD-10-CM

## 2013-06-03 DIAGNOSIS — E785 Hyperlipidemia, unspecified: Secondary | ICD-10-CM

## 2013-06-03 LAB — COMPLETE METABOLIC PANEL WITH GFR
ALT: 64 U/L — ABNORMAL HIGH (ref 0–53)
AST: 33 U/L (ref 0–37)
Alkaline Phosphatase: 57 U/L (ref 39–117)
BUN: 15 mg/dL (ref 6–23)
Creat: 0.95 mg/dL (ref 0.50–1.35)
Sodium: 140 mEq/L (ref 135–145)
Total Bilirubin: 1.1 mg/dL (ref 0.3–1.2)

## 2013-06-03 MED ORDER — ROSUVASTATIN CALCIUM 20 MG PO TABS: ORAL_TABLET | ORAL | Status: DC

## 2013-06-03 NOTE — Progress Notes (Signed)
CC: Shawn Richards is a 37 y.o. male is here for f/u hyperlipidemia   Subjective: HPI:  Followup hyperlipidemia: Has been taking Crestor for over 5 years now without any noted side effects. History of elevated liver enzymes mild, was present prior to starting Crestor if he remembers correctly tries to stay active on a daily basis, Eats a healthy diet. Takes fish oil daily.  Denies right upper quadrant pain, myalgias, chest pain, shortness of breath, nor limb claudication   Review Of Systems Outlined In HPI  Past Medical History  Diagnosis Date  . Hypertension 2006    treated for only 6 months  . Hyperlipidemia   . Fatty liver   . Hypotestosteronism      Family History  Problem Relation Age of Onset  . Cancer Father     prostate  . Hyperlipidemia Father   . Hyperlipidemia Mother      History  Substance Use Topics  . Smoking status: Never Smoker   . Smokeless tobacco: Never Used  . Alcohol Use: 1.0 oz/week    2 drink(s) per week     Comment: occasional     Objective: Filed Vitals:   06/03/13 0845  BP: 132/76  Pulse: 58    General: Alert and Oriented, No Acute Distress HEENT: Pupils equal, round, reactive to light. Conjunctivae clear.   moist mucous membranes  Lungs: Clear to auscultation bilaterally, no wheezing/ronchi/rales.  Comfortable work of breathing. Good air movement. Cardiac: Regular rate and rhythm. Normal S1/S2.  No murmurs, rubs, nor gallops.   Abdomen:  soft nontender  Extremities: No peripheral edema.  Strong peripheral pulses.   Assessment & Plan: Shawn Richards was seen today for f/u hyperlipidemia.  Diagnoses and associated orders for this visit:  HYPERLIPIDEMIA - rosuvastatin (CRESTOR) 20 MG tablet; One by mouth daily.  ELEVATED BLOOD PRESSURE WITHOUT DIAGNOSIS OF HYPERTENSION - COMPLETE METABOLIC PANEL WITH GFR    Hyperlipidemia: Reviewed labs with him perfect LDL deficient HDL encouraged to increase exercise and fish in the diet, focus on  whole grains.  Checking liver enzymes today. Continue Crestor   Return in about 6 months (around 12/02/2013).

## 2014-08-16 ENCOUNTER — Telehealth: Payer: Self-pay | Admitting: Family Medicine

## 2014-08-16 NOTE — Telephone Encounter (Signed)
Pt has not been seen since 05/2013. Will need appt first. Left this on vm

## 2014-08-16 NOTE — Telephone Encounter (Signed)
Patient called adv that he is scheduled for 08/28/14 and req to know if he can get labs done a head of time before his appt. Pt req a call back to know if he can get labs early or not. Thanks

## 2014-08-28 ENCOUNTER — Encounter: Payer: Self-pay | Admitting: Family Medicine

## 2014-08-28 ENCOUNTER — Ambulatory Visit (INDEPENDENT_AMBULATORY_CARE_PROVIDER_SITE_OTHER): Payer: BLUE CROSS/BLUE SHIELD | Admitting: Family Medicine

## 2014-08-28 VITALS — BP 128/80 | HR 95 | Ht 76.0 in | Wt 248.0 lb

## 2014-08-28 DIAGNOSIS — Z5181 Encounter for therapeutic drug level monitoring: Secondary | ICD-10-CM

## 2014-08-28 DIAGNOSIS — Z79899 Other long term (current) drug therapy: Secondary | ICD-10-CM

## 2014-08-28 DIAGNOSIS — E785 Hyperlipidemia, unspecified: Secondary | ICD-10-CM | POA: Diagnosis not present

## 2014-08-28 DIAGNOSIS — R030 Elevated blood-pressure reading, without diagnosis of hypertension: Secondary | ICD-10-CM

## 2014-08-28 DIAGNOSIS — Z8042 Family history of malignant neoplasm of prostate: Secondary | ICD-10-CM | POA: Diagnosis not present

## 2014-08-28 MED ORDER — ROSUVASTATIN CALCIUM 20 MG PO TABS: ORAL_TABLET | ORAL | Status: DC

## 2014-08-28 NOTE — Progress Notes (Signed)
CC: Shawn Richards is a 39 y.o. male is here for Follow-up   Subjective: HPI:  Follow-up elevated blood pressure: No outside blood pressures to report. Since we saw him last he has lost 20 pounds by focusing on portion control. No formal exercise routine. Trying to watch salt intake.  Follow-up hyperlipidemia: Continues to take Crestor on a daily basis without right upper quadrant pain or myalgias. No chest pain since I saw him last. No shortness of breath, peripheral edema, claudication, no motor or sensory disturbances.  He tells me today that his father has been diagnosed with prostate cancer when he was in his 6760s. Patient would like to be aggressive about screening for prostate cancer. He denies any genitourinary complaints. He is only awakening 0-1 times a night to urinate, no sensation of incomplete voiding   Review Of Systems Outlined In HPI  Past Medical History  Diagnosis Date  . Hypertension 2006    treated for only 6 months  . Hyperlipidemia   . Fatty liver   . Hypotestosteronism     Past Surgical History  Procedure Laterality Date  . Tympanostomy tube placement    . Sinus polyps  1995   Family History  Problem Relation Age of Onset  . Cancer Father     prostate  . Hyperlipidemia Father   . Hyperlipidemia Mother     History   Social History  . Marital Status: Married    Spouse Name: N/A  . Number of Children: N/A  . Years of Education: N/A   Occupational History  . Not on file.   Social History Main Topics  . Smoking status: Never Smoker   . Smokeless tobacco: Never Used  . Alcohol Use: 1.0 oz/week    2 drink(s) per week     Comment: occasional  . Drug Use: Not on file  . Sexual Activity: Yes    Birth Control/ Protection: Surgical   Other Topics Concern  . Not on file   Social History Narrative  . No narrative on file     Objective: BP 128/80 mmHg  Pulse 95  Ht 6\' 4"  (1.93 m)  Wt 248 lb (112.492 kg)  BMI 30.20 kg/m2  General: Alert  and Oriented, No Acute Distress HEENT: Pupils equal, round, reactive to light. Conjunctivae clear.  Moist mucous membranes Lungs: Clear to auscultation bilaterally, no wheezing/ronchi/rales.  Comfortable work of breathing. Good air movement. Cardiac: Regular rate and rhythm. Normal S1/S2.  No murmurs, rubs, nor gallops.  No carotid bruit Extremities: No peripheral edema.  Strong peripheral pulses.  Mental Status: No depression, anxiety, nor agitation. Skin: Warm and dry.  Assessment & Plan: [name] was seen today for follow-up.  Diagnoses and all orders for this visit:  ELEVATED BLOOD PRESSURE WITHOUT DIAGNOSIS OF HYPERTENSION  Hyperlipidemia Orders: -     Lipid panel -     Hepatic function panel -     rosuvastatin (CRESTOR) 20 MG tablet; One by mouth daily.  Encounter for monitoring statin therapy Orders: -     Hepatic function panel  Family history of prostate cancer Orders: -     PSA   Elevated blood pressure: Controlled, congratulated his success with weight loss Hyperlipidemia: Clinically controlled but due for repeat lipid panel and liver function, continue Crestor pending above results Family history of prostate cancer check PSA today.  Return in about 1 year (around 08/28/2015).

## 2014-08-29 ENCOUNTER — Telehealth: Payer: Self-pay

## 2014-08-29 LAB — LIPID PANEL
Cholesterol: 245 mg/dL — ABNORMAL HIGH (ref 0–200)
HDL: 43 mg/dL (ref >=40)
LDL Cholesterol: 184 mg/dL — ABNORMAL HIGH (ref 0–99)
Total CHOL/HDL Ratio: 5.7 Ratio
Triglycerides: 92 mg/dL (ref <150)
VLDL: 18 mg/dL (ref 0–40)

## 2014-08-29 LAB — HEPATIC FUNCTION PANEL
ALK PHOS: 43 U/L (ref 39–117)
ALT: 40 U/L (ref 0–53)
AST: 23 U/L (ref 0–37)
Albumin: 4.5 g/dL (ref 3.5–5.2)
Bilirubin, Direct: 0.2 mg/dL (ref 0.0–0.3)
Indirect Bilirubin: 1 mg/dL (ref 0.2–1.2)
TOTAL PROTEIN: 6.5 g/dL (ref 6.0–8.3)
Total Bilirubin: 1.2 mg/dL (ref 0.2–1.2)

## 2014-08-29 NOTE — Telephone Encounter (Signed)
Received Fax for Pa on crestor sent through cover my meds waiting on auth. - CF

## 2014-08-30 LAB — PSA: PSA: 1.21 ng/mL (ref <=4.00)

## 2014-08-31 ENCOUNTER — Telehealth: Payer: Self-pay | Admitting: Family Medicine

## 2014-08-31 NOTE — Telephone Encounter (Signed)
Marylene Landngela, I found a 06/02/2007 office note from Dr. Cathey EndowBowen stating:  Chief Complaint: NOV, CHOLESTEROL, and TOE NAIL PROBLEM.  History of Present Illness: 39 yo [place] presents to establish care for hyperlipidemia. Diagnosed at age 39. Has been on Vytorin, Zetia, Lipitor, Zocor and is now on Crestor. Notes having liver enzyme elevation, with change of meds and doses.  He has failed multiple other statins, this should help his crestor PA,

## 2014-08-31 NOTE — Telephone Encounter (Signed)
Thank you Dr Ivan AnchorsHommel. We can fax this office note with the PA form.

## 2014-09-01 NOTE — Telephone Encounter (Signed)
Received Fax from [facility] and the crestor was authorized from 08/31/2014 -08/31/2019 - CF

## 2015-05-22 ENCOUNTER — Ambulatory Visit (INDEPENDENT_AMBULATORY_CARE_PROVIDER_SITE_OTHER): Payer: BLUE CROSS/BLUE SHIELD | Admitting: Family Medicine

## 2015-05-22 ENCOUNTER — Encounter: Payer: Self-pay | Admitting: Family Medicine

## 2015-05-22 VITALS — BP 148/92 | HR 55 | Wt 258.0 lb

## 2015-05-22 DIAGNOSIS — J329 Chronic sinusitis, unspecified: Secondary | ICD-10-CM | POA: Diagnosis not present

## 2015-05-22 DIAGNOSIS — A499 Bacterial infection, unspecified: Secondary | ICD-10-CM

## 2015-05-22 DIAGNOSIS — B9689 Other specified bacterial agents as the cause of diseases classified elsewhere: Secondary | ICD-10-CM

## 2015-05-22 MED ORDER — AMOXICILLIN-POT CLAVULANATE 500-125 MG PO TABS: ORAL_TABLET | ORAL | Status: AC

## 2015-05-22 NOTE — Progress Notes (Signed)
CC: Shawn Richards J Wojciak is a 39 y.o. male is here for Sinusitis   Subjective: HPI:  Pressure in the cheeks, nasal congestion, postnasal drip with sore throat that has been present for the past 10 days. Symptoms are persistent and moderate in severity. Worse in the evening. No benefit from NyQuil and other over-the-counter cough and cold medications. Denies fevers, chills, wheezing, cough, shortness of breath, nor headache. No rash   Review Of Systems Outlined In HPI  Past Medical History  Diagnosis Date  . Hypertension 2006    treated for only 6 months  . Hyperlipidemia   . Fatty liver   . Hypotestosteronism     Past Surgical History  Procedure Laterality Date  . Tympanostomy tube placement    . Sinus polyps  1995   Family History  Problem Relation Age of Onset  . Cancer Father     prostate  . Hyperlipidemia Father   . Hyperlipidemia Mother     Social History   Social History  . Marital Status: Married    Spouse Name: N/A  . Number of Children: N/A  . Years of Education: N/A   Occupational History  . Not on file.   Social History Main Topics  . Smoking status: Never Smoker   . Smokeless tobacco: Never Used  . Alcohol Use: 1.0 oz/week    2 drink(s) per week     Comment: occasional  . Drug Use: Not on file  . Sexual Activity: Yes    Birth Control/ Protection: Surgical   Other Topics Concern  . Not on file   Social History Narrative     Objective: BP 148/92 mmHg  Pulse 55  Wt 258 lb (117.028 kg)  General: Alert and Oriented, No Acute Distress HEENT: Pupils equal, round, reactive to light. Conjunctivae clear.  External ears unremarkable, canals clear with intact TMs with appropriate landmarks.  Middle ear appears open without effusion. Pink inferior turbinates.  Moist mucous membranes, pharynx without inflammation nor lesions other than moderate postnasal drip.  Neck supple without palpable lymphadenopathy nor abnormal masses. Lungs: Clear to auscultation  bilaterally, no wheezing/ronchi/rales.  Comfortable work of breathing. Good air movement. Extremities: No peripheral edema.  Strong peripheral pulses.  Mental Status: No depression, anxiety, nor agitation. Skin: Warm and dry.  Assessment & Plan: Shawn Richards was seen today for sinusitis.  Diagnoses and all orders for this visit:  Bacterial sinusitis -     amoxicillin-clavulanate (AUGMENTIN) 500-125 MG tablet; Take one by mouth every 8 hours for ten total days.   Bacterial sinusitis: Start Augmentin consider nasal saline washes. Call if no better by Friday  Return if symptoms worsen or fail to improve.

## 2015-09-14 ENCOUNTER — Other Ambulatory Visit: Payer: Self-pay | Admitting: Family Medicine

## 2015-11-16 ENCOUNTER — Other Ambulatory Visit: Payer: Self-pay | Admitting: Family Medicine

## 2016-01-17 ENCOUNTER — Other Ambulatory Visit: Payer: Self-pay | Admitting: Family Medicine

## 2016-01-22 ENCOUNTER — Telehealth: Payer: Self-pay | Admitting: Family Medicine

## 2016-01-22 ENCOUNTER — Other Ambulatory Visit: Payer: Self-pay | Admitting: Family Medicine

## 2016-01-22 NOTE — Telephone Encounter (Signed)
Sent to the pharmacy

## 2016-01-22 NOTE — Telephone Encounter (Signed)
Pt called and needs refill on Crestor.  Thank you.

## 2016-01-23 ENCOUNTER — Telehealth: Payer: Self-pay | Admitting: Family Medicine

## 2016-01-23 DIAGNOSIS — R7989 Other specified abnormal findings of blood chemistry: Secondary | ICD-10-CM

## 2016-01-23 DIAGNOSIS — R945 Abnormal results of liver function studies: Principal | ICD-10-CM

## 2016-01-23 DIAGNOSIS — E785 Hyperlipidemia, unspecified: Secondary | ICD-10-CM

## 2016-01-23 NOTE — Telephone Encounter (Signed)
Lipid panel and liver function, in your in box.

## 2016-01-23 NOTE — Telephone Encounter (Signed)
Is cholesterol the only you want to check? Please advise.

## 2016-01-23 NOTE — Telephone Encounter (Signed)
Pt called. He has an appointment scheduled for 8/7 @ 8:30 for refill on cholesterol meds  and would like to come tomorrow to have labs drawn to check cholesterol levels only.  Please call pt to let him know if this is okay. Thank you!

## 2016-01-23 NOTE — Telephone Encounter (Signed)
Pt.notified

## 2016-01-25 LAB — LIPID PANEL
CHOLESTEROL: 163 mg/dL (ref 125–200)
HDL: 48 mg/dL (ref >=40)
LDL Cholesterol: 97 mg/dL (ref <130)
TRIGLYCERIDES: 89 mg/dL (ref <150)
Total CHOL/HDL Ratio: 3.4 Ratio (ref <=5.0)
VLDL: 18 mg/dL (ref <30)

## 2016-01-25 LAB — HEPATIC FUNCTION PANEL
ALBUMIN: 4.7 g/dL (ref 3.6–5.1)
ALK PHOS: 47 U/L (ref 40–115)
ALT: 68 U/L — AB (ref 9–46)
AST: 35 U/L (ref 10–40)
BILIRUBIN INDIRECT: 0.9 mg/dL (ref 0.2–1.2)
Bilirubin, Direct: 0.2 mg/dL (ref <=0.2)
TOTAL PROTEIN: 6.7 g/dL (ref 6.1–8.1)
Total Bilirubin: 1.1 mg/dL (ref 0.2–1.2)

## 2016-01-28 ENCOUNTER — Ambulatory Visit (INDEPENDENT_AMBULATORY_CARE_PROVIDER_SITE_OTHER): Payer: BLUE CROSS/BLUE SHIELD | Admitting: Family Medicine

## 2016-01-28 ENCOUNTER — Encounter: Payer: Self-pay | Admitting: Family Medicine

## 2016-01-28 VITALS — BP 135/81 | HR 53 | Wt 263.0 lb

## 2016-01-28 DIAGNOSIS — M7711 Lateral epicondylitis, right elbow: Secondary | ICD-10-CM

## 2016-01-28 DIAGNOSIS — E785 Hyperlipidemia, unspecified: Secondary | ICD-10-CM

## 2016-01-28 MED ORDER — ROSUVASTATIN CALCIUM 20 MG PO TABS: ORAL_TABLET | ORAL | 1 refills | Status: DC

## 2016-01-28 NOTE — Progress Notes (Signed)
CC: Shawn BenchMatthew J Richards is a 40 y.o. male is here for Hyperlipidemia and Medication Refill   Subjective: HPI:  Follow-up hyperlipidemia: He had lipid panel drawn last week showed a favorable LDL. He is taking Crestor half a tablet daily. He denies any myalgias, right upper quadrant pain or chest pain.  His only complaint today is right elbow pain as been present for the last 3 or 4 weeks. Interventions have included chiropractic however this is not helping at all. Symptoms are worse with extension of the elbow. He has difficulty handling a gallon of milk. It's localized on the lateral aspect of the elbow and nonradiating. It's sharp and has not been getting better or worsens onset he denies any recent trauma or overexertion.   Review Of Systems Outlined In HPI  Past Medical History:  Diagnosis Date  . Fatty liver   . Hyperlipidemia   . Hypertension 2006   treated for only 6 months  . Hypotestosteronism     Past Surgical History:  Procedure Laterality Date  . sinus polyps  1995  . TYMPANOSTOMY TUBE PLACEMENT     Family History  Problem Relation Age of Onset  . Cancer Father     prostate  . Hyperlipidemia Father   . Hyperlipidemia Mother     Social History   Social History  . Marital status: Married    Spouse name: N/A  . Number of children: N/A  . Years of education: N/A   Occupational History  . Not on file.   Social History Main Topics  . Smoking status: Never Smoker  . Smokeless tobacco: Never Used  . Alcohol use 1.0 oz/week    2 drink(s) per week     Comment: occasional  . Drug use: Unknown  . Sexual activity: Yes    Birth control/ protection: Surgical   Other Topics Concern  . Not on file   Social History Narrative  . No narrative on file     Objective: BP 135/81   Pulse (!) 53   Wt 263 lb (119.3 kg)   BMI 32.01 kg/m   Vital signs reviewed. General: Alert and Oriented, No Acute Distress HEENT: Pupils equal, round, reactive to light. Conjunctivae  clear.  External ears unremarkable.  Moist mucous membranes. Lungs: Clear and comfortable work of breathing, speaking in full sentences without accessory muscle use. Cardiac: Regular rate and rhythm.  Neuro: CN II-XII grossly intact, gait normal. Extremities: No peripheral edema.  Strong peripheral pulses. Pain is reproduced with palpation of the right lateral epicondyle. Pain is also reproduced with resisted wrist extension Mental Status: No depression, anxiety, nor agitation. Logical though process. Skin: Warm and dry. Assessment & Plan: Shawn Richards was seen today for hyperlipidemia and medication refill.  Diagnoses and all orders for this visit:  Hyperlipidemia  Right tennis elbow  Other orders -     rosuvastatin (CRESTOR) 20 MG tablet; TAKE 1 TABLET BY MOUTH ONCE A DAY   Hyperlipidemia: Controlled continue Crestor daily. Follow-up in 6 months to have this rechecked Right tennis elbow: He was given a home rehabilitation plan to engage in for the next to 3 weeks if no benefit after that time please call me so I can arrange a possible steroid injection with my one of my partners.  No Follow-up on file.

## 2016-03-13 ENCOUNTER — Ambulatory Visit (INDEPENDENT_AMBULATORY_CARE_PROVIDER_SITE_OTHER): Payer: BLUE CROSS/BLUE SHIELD | Admitting: Sports Medicine

## 2016-03-13 DIAGNOSIS — M7711 Lateral epicondylitis, right elbow: Secondary | ICD-10-CM | POA: Diagnosis not present

## 2016-03-13 MED ORDER — MELOXICAM 15 MG PO TABS: 15.0000 mg | ORAL_TABLET | Freq: Every day | ORAL | 3 refills | Status: DC

## 2016-03-13 NOTE — Progress Notes (Signed)
   Subjective:    I'm seeing this patient as a consultation for:  Dr. Laren BoomSean Hommel  CC: Right elbow pain  HPI: For over a month this pleasant 40 year old male has had pain he localizes on the lateral aspect of the right elbow, worse with gripping, opening doors. He was seen by Dr. Ivan AnchorsHommel and treated appropriately with conservative measures including rehabilitation exercises, bracing, unfortunately his symptoms have not resolved, they have plateaued. Pain is moderate, persistent, localized without radiation.  Past medical history:  Negative.  See flowsheet/record as well for more information.  Surgical history: Negative.  See flowsheet/record as well for more information.  Family history: Negative.  See flowsheet/record as well for more information.  Social history: Negative.  See flowsheet/record as well for more information.  Allergies, and medications have been entered into the medical record, reviewed, and no changes needed.   Review of Systems: No headache, visual changes, nausea, vomiting, diarrhea, constipation, dizziness, abdominal pain, skin rash, fevers, chills, night sweats, weight loss, swollen lymph nodes, body aches, joint swelling, muscle aches, chest pain, shortness of breath, mood changes, visual or auditory hallucinations.   Objective:   General: Well Developed, well nourished, and in no acute distress.  Neuro/Psych: Alert and oriented x3, extra-ocular muscles intact, able to move all 4 extremities, sensation grossly intact. Skin: Warm and dry, no rashes noted.  Respiratory: Not using accessory muscles, speaking in full sentences, trachea midline.  Cardiovascular: Pulses palpable, no extremity edema. Abdomen: Does not appear distended. Right Elbow: Unremarkable to inspection. Range of motion full pronation, supination, flexion, extension. Strength is full to all of the above directions Stable to varus, valgus stress. Negative moving valgus stress test. No tender to  palpation at the common extensor tendon origin Ulnar nerve does not sublux. Negative cubital tunnel Tinel's.  Procedure: Real-time Ultrasound Guided Injection of right common extensor tendon origin Device: GE Logiq E  Verbal informed consent obtained.  Time-out conducted.  Noted no overlying erythema, induration, or other signs of local infection.  Skin prepped in a sterile fashion.  Local anesthesia: Topical Ethyl chloride.  With sterile technique and under real time ultrasound guidance:  1 mL Kenalog 40, 1 mL lidocaine, 1 mL Marcaine injected easily, I noted only a small gap in the deep insertion of the common extensor tendon Completed without difficulty  Pain immediately resolved suggesting accurate placement of the medication.  Advised to call if fevers/chills, erythema, induration, drainage, or persistent bleeding.  Images permanently stored and available for review in the ultrasound unit.  Impression: Technically successful ultrasound guided injection.  Impression and Recommendations:   This case required medical decision making of moderate complexity.  Lateral epicondylitis, right elbow Has failed conservative measures. Adding meloxicam. Injection as above, return to see me in one month.

## 2016-03-13 NOTE — Assessment & Plan Note (Addendum)
Has failed conservative measures. Adding meloxicam. Injection as above, return to see me in one month.

## 2016-04-16 ENCOUNTER — Ambulatory Visit: Payer: BLUE CROSS/BLUE SHIELD | Admitting: Sports Medicine

## 2016-07-11 ENCOUNTER — Telehealth: Payer: Self-pay | Admitting: *Deleted

## 2016-07-11 NOTE — Telephone Encounter (Signed)
PA submitted and approved [facility] Case ID: [id number]  Patient and pharmacy notified

## 2016-09-18 ENCOUNTER — Ambulatory Visit (INDEPENDENT_AMBULATORY_CARE_PROVIDER_SITE_OTHER): Payer: 59 | Admitting: Physician Assistant

## 2016-09-18 VITALS — BP 138/90 | HR 69 | Temp 98.1°F | Wt 271.0 lb

## 2016-09-18 DIAGNOSIS — J069 Acute upper respiratory infection, unspecified: Secondary | ICD-10-CM | POA: Diagnosis not present

## 2016-09-18 MED ORDER — IPRATROPIUM BROMIDE 0.06 % NA SOLN: 1.0000 | Freq: Four times a day (QID) | NASAL | 0 refills | Status: DC | PRN

## 2016-09-18 MED ORDER — IPRATROPIUM-ALBUTEROL 0.5-2.5 (3) MG/3ML IN SOLN
3.0000 mL | Freq: Once | RESPIRATORY_TRACT | Status: AC
  Administered 2016-09-18: 3 mL via RESPIRATORY_TRACT

## 2016-09-18 MED ORDER — BENZONATATE 200 MG PO CAPS: 200.0000 mg | ORAL_CAPSULE | Freq: Three times a day (TID) | ORAL | 0 refills | Status: DC | PRN

## 2016-09-18 NOTE — Progress Notes (Signed)
HPI:                                                                Shawn Richards is a 41 y.o. male [facility] today for cough and congestion  Patient with PMH of HTN and HLD presents with cough, chest and nasal congestion x 4 days. Endorses fatigue and mild sore throat. Cough is mildly productive of mucus. Denies hemoptysis, dyspnea, or wheezing. Denies fevers, chills, myalgias, otalgia. Denies n/v/d. Nonsmoker. No history of asthma or allergies. History of sinus polypectomy and tympanostomy tubes.   Past Medical History:  Diagnosis Date  . Fatty liver   . Hyperlipidemia   . Hypertension 2006   treated for only 6 months  . Hypotestosteronism    Past Surgical History:  Procedure Laterality Date  . sinus polyps  1995  . TYMPANOSTOMY TUBE PLACEMENT     Social History  Substance Use Topics  . Smoking status: Never Smoker  . Smokeless tobacco: Never Used  . Alcohol use 1.0 oz/week    2 drink(s) per week     Comment: occasional   family history includes Cancer in his father; Hyperlipidemia in his father and mother.  ROS: negative except as noted in the HPI  Medications: Current Outpatient Prescriptions  Medication Sig Dispense Refill  . meloxicam (MOBIC) 15 MG tablet Take 1 tablet (15 mg total) by mouth daily. 30 tablet 3  . rosuvastatin (CRESTOR) 20 MG tablet TAKE 1 TABLET BY MOUTH ONCE A DAY 90 tablet 1   No current facility-administered medications for this visit.    Allergies  Allergen Reactions  . Lipitor [Atorvastatin]     LFTs  . Zocor [Simvastatin]     LFTs       Objective:  BP 138/90   Pulse 69   Temp 98.1 F (36.7 C) (Oral)   Wt 271 lb (122.9 kg)   BMI 32.99 kg/m  Gen: well-groomed, cooperative, not ill-appearing, no distress HEENT: normal conjunctiva, TM's clear, nasal mucosa edematous, oropharynx clear, moist mucus membranes, there is right maxillary sinus tenderness Pulm:  Normal work of breathing, normal phonation, clear to auscultation bilaterally, no wheezes, rales or rhonchi CV: Normal rate, regular rhythm, s1 and s2 distinct, no murmurs, clicks or rubs  Neuro: alert and oriented x 3, EOM's intact Lymph: no cervical or tonsillar adenopathy Skin: warm and dry, no rashes or lesions on exposed skin, no cyanosis   No results found for this or any previous visit (from the past 72 hour(s)). No results found.    Assessment and Plan: 41 y.o. male with   1. Acute upper respiratory infection - symptomatic management with nasal spray, expectorant and acetaminophen  - duoneb given in clinic for patient's chest congestion - no wheezes, rales or rhonchi on exam - benzonatate (TESSALON) 200 MG capsule; Take 1 capsule (200 mg total) by mouth 3 (three) times daily as needed for cough.  Dispense: 30 capsule; Refill: 0 - ipratropium (ATROVENT) 0.06 % nasal spray; Place 1 spray into both nostrils 4 (four) times daily as needed.  Dispense: 15 mL; Refill: 0 - ipratropium-albuterol (DUONEB) 0.5-2.5 (3) MG/3ML nebulizer solution 3 mL; Take 3 mLs by nebulization once.   Patient education and anticipatory  guidance given Patient agrees with treatment plan Follow-up as needed if symptoms worsen or fail to improve  Darlyne Russian PA-C

## 2016-09-18 NOTE — Patient Instructions (Addendum)
Benzonotate: 200mg  three times daily as needed for cough  Atrovent nasal spray up to four times per day for nasal congestion and sinus pressure  Mucinex (guafenisen): 400 mg ORALLY every 4 hours with at least 8 oz of water - to help break up the mucus and chest congestion  Tylenol 500mg  every 6 hours as needed for headache, sore throat, body aches   Upper Respiratory Infection, Adult Most upper respiratory infections (URIs) are a viral infection of the air passages leading to the lungs. A URI affects the nose, throat, and upper air passages. The most common type of URI is nasopharyngitis and is typically referred to as "the common cold." URIs run their course and usually go away on their own. Most of the time, a URI does not require medical attention, but sometimes a bacterial infection in the upper airways can follow a viral infection. This is called a secondary infection. Sinus and middle ear infections are common types of secondary upper respiratory infections. Bacterial pneumonia can also complicate a URI. A URI can worsen asthma and chronic obstructive pulmonary disease (COPD). Sometimes, these complications can require emergency medical care and may be life threatening. What are the causes? Almost all URIs are caused by viruses. A virus is a type of germ and can spread from one person to another. What increases the risk? You may be at risk for a URI if:  You smoke.  You have chronic heart or lung disease.  You have a weakened defense (immune) system.  You are very young or very old.  You have nasal allergies or asthma.  You work in crowded or poorly ventilated areas.  You work in health care facilities or schools. What are the signs or symptoms? Symptoms typically develop 2-3 days after you come in contact with a cold virus. Most viral URIs last 7-10 days. However, viral URIs from the influenza virus (flu virus) can last 14-18 days and are typically more severe. Symptoms may  include:  Runny or stuffy (congested) nose.  Sneezing.  Cough.  Sore throat.  Headache.  Fatigue.  Fever.  Loss of appetite.  Pain in your forehead, behind your eyes, and over your cheekbones (sinus pain).  Muscle aches. How is this diagnosed? Your health care provider may diagnose a URI by:  Physical exam.  Tests to check that your symptoms are not due to another condition such as:  Strep throat.  Sinusitis.  Pneumonia.  Asthma. How is this treated? A URI goes away on its own with time. It cannot be cured with medicines, but medicines may be prescribed or recommended to relieve symptoms. Medicines may help:  Reduce your fever.  Reduce your cough.  Relieve nasal congestion. Follow these instructions at home:  Take medicines only as directed by your health care provider.  Gargle warm saltwater or take cough drops to comfort your throat as directed by your health care provider.  Use a warm mist humidifier or inhale steam from a shower to increase air moisture. This may make it easier to breathe.  Drink enough fluid to keep your urine clear or pale yellow.  Eat soups and other clear broths and maintain good nutrition.  Rest as needed.  Return to work when your temperature has returned to normal or as your health care provider advises. You may need to stay home longer to avoid infecting others. You can also use a face mask and careful hand washing to prevent spread of the virus.  Increase the usage  of your inhaler if you have asthma.  Do not use any tobacco products, including cigarettes, chewing tobacco, or electronic cigarettes. If you need help quitting, ask your health care provider. How is this prevented? The best way to protect yourself from getting a cold is to practice good hygiene.  Avoid oral or hand contact with people with cold symptoms.  Wash your hands often if contact occurs. There is no clear evidence that vitamin C, vitamin E,  echinacea, or exercise reduces the chance of developing a cold. However, it is always recommended to get plenty of rest, exercise, and practice good nutrition. Contact a health care provider if:  You are getting worse rather than better.  Your symptoms are not controlled by medicine.  You have chills.  You have worsening shortness of breath.  You have brown or red mucus.  You have yellow or brown nasal discharge.  You have pain in your face, especially when you bend forward.  You have a fever.  You have swollen neck glands.  You have pain while swallowing.  You have white areas in the back of your throat. Get help right away if:  You have severe or persistent:  Headache.  Ear pain.  Sinus pain.  Chest pain.  You have chronic lung disease and any of the following:  Wheezing.  Prolonged cough.  Coughing up blood.  A change in your usual mucus.  You have a stiff neck.  You have changes in your:  Vision.  Hearing.  Thinking.  Mood. This information is not intended to replace advice given to you by your health care provider. Make sure you discuss any questions you have with your health care provider. Document Released: 12/03/2000 Document Revised: 02/10/2016 Document Reviewed: 09/14/2013 Elsevier Interactive Patient Education  2017 [employer].

## 2016-10-01 ENCOUNTER — Encounter: Payer: Self-pay | Admitting: Osteopathic Medicine

## 2016-10-01 ENCOUNTER — Ambulatory Visit (INDEPENDENT_AMBULATORY_CARE_PROVIDER_SITE_OTHER): Payer: 59 | Admitting: Osteopathic Medicine

## 2016-10-01 ENCOUNTER — Ambulatory Visit (INDEPENDENT_AMBULATORY_CARE_PROVIDER_SITE_OTHER): Payer: 59

## 2016-10-01 VITALS — BP 128/65 | HR 113 | Temp 97.5°F | Wt 265.0 lb

## 2016-10-01 DIAGNOSIS — R0981 Nasal congestion: Secondary | ICD-10-CM | POA: Diagnosis not present

## 2016-10-01 DIAGNOSIS — R05 Cough: Secondary | ICD-10-CM

## 2016-10-01 DIAGNOSIS — R195 Other fecal abnormalities: Secondary | ICD-10-CM | POA: Diagnosis not present

## 2016-10-01 DIAGNOSIS — J40 Bronchitis, not specified as acute or chronic: Secondary | ICD-10-CM | POA: Diagnosis not present

## 2016-10-01 DIAGNOSIS — R059 Cough, unspecified: Secondary | ICD-10-CM

## 2016-10-01 MED ORDER — AMOXICILLIN-POT CLAVULANATE 875-125 MG PO TABS: 1.0000 | ORAL_TABLET | Freq: Two times a day (BID) | ORAL | 0 refills | Status: DC

## 2016-10-01 MED ORDER — METHYLPREDNISOLONE 4 MG PO TBPK: ORAL_TABLET | ORAL | 0 refills | Status: DC

## 2016-10-01 MED ORDER — GUAIFENESIN-CODEINE 100-10 MG/5ML PO SYRP: 5.0000 mL | ORAL_SOLUTION | Freq: Three times a day (TID) | ORAL | 0 refills | Status: DC | PRN

## 2016-10-01 NOTE — Patient Instructions (Signed)
Plan:   Treating as bacterial sinusitis causing cough, I suspect you've also picked up a new viral bronchitis which may be causing cough, fever, fatigue and loose stool.    Trial antibiotics, steroid pack and cough prescriptions.    Continue Atrovent nasal spray, try acetaminophen +/- ibuprofen for aches/pains. See below for other OTC meds and home remedies   Medications & Home Remedies for Respiratory Illness  Note: the following list assumes no pregnancy, normal liver & kidney function and no other drug interactions. Dr. Lyn Hollingshead has highlighted medications which are safe for you to use, but these may not be appropriate for everyone. Always ask a pharmacist or qualified medical provider if you have any questions!   Aches/Pains, Fever, Headache Acetaminophen (Tylenol) 500 mg tablets - take max 2 tablets (1000 mg) every 6 hours (4 times per day)  Ibuprofen (Motrin) 200 mg tablets - take max 4 tablets (800 mg) every 6 hours*  Sinus Congestion Prescription Atrovent as directed Nasal Saline if desired Oxymetolazone (Afrin, others) sparing use due to rebound congestion, NEVER use in kids Phenylephrine (Sudafed) 10 mg tablets every 4 hours (or the 12-hour formulation)* Diphenhydramine (Benadryl) 25 mg tablets - take max 2 tablets every 4 hours  Cough & Sore Throat Prescription cough pills or syrups as directed Dextromethorphan (Robitussin, others) - cough suppressant Guaifenesin (Robitussin, Mucinex, others) - expectorant (helps cough up mucus) (Dextromethorphan and Guaifenesin also come in a combination tablet) Lozenges w/ Benzocaine + Menthol (Cepacol) Honey - as much as you want! Teas which "coat the throat" - look for ingredients Elm Bark, Licorice Root, Marshmallow Root  Other Antibiotics if these are prescribed - take ALL, even if you're feeling better  Zinc Lozenges within 24 hours of symptoms onset - mixed evidence this shortens the duration of the common cold Don't waste  your money on Vitamin C or Echinacea  *Caution in patients with high blood pressure

## 2016-10-01 NOTE — Progress Notes (Signed)
HPI: Shawn Richards is a 41 y.o. male who presents to [facility] for chief complaint of:  Chief Complaint  Patient presents with  . Nasal Congestion    Acute Illness: . Seen 09/18/16 (13 days ago) for cough and sinus congestion x4 days. Symptomatic care for acute URI w/ duoneb in office and at home, tessalon, atrovent, advised OTC guaifenisen, tylenol.  . Patient states symptoms seemed to have gone up into the sinuses for a while, now also down into chest again with significant cough, additionally had elevated temperature last night of 101, severe cough, loose stool, significant fatigue and low appetite. Able to drink normally.    Past medical, social and family history reviewed. Immune compromising conditions or other risk factors: none  Current medications and allergies reviewed.     Review of Systems:  Constitutional: Yes  fever/chills  HEENT: mild headache, some sore throat, No  swollen glands  Cardiovascular: No chest pain  Respiratory:Yes  cough, No  shortness of breath  Gastrointestinal: Yes  nausea, No  vomiting,  Yes  diarrhea  Musculoskeletal:   No  myalgia/arthralgia  Skin/Integument:  No rash   Detailed Exam:  BP 128/65   Pulse (!) 113   Temp 97.5 F (36.4 C) (Oral)   Wt 265 lb (120.2 kg)   BMI 32.26 kg/m   Constitutional:   VSS, see above.   General Appearance: alert, well-developed, well-nourished, NAD  Eyes:   Normal lids and conjunctive, non-icteric sclera  Ears, Nose, Mouth, Throat:   Normal external inspection ears/nares  Normal mouth/lips/gums, MMM  normal TM  posterior pharynx without erythema, without exudate  nasal mucosa normal  Skin:  Normal inspection, no rash or concerning lesions noted on limited exam  Neck:   No masses, trachea midline. normal lymph nodes  Respiratory:   Normal respiratory effort.   No  wheeze/rhonchi/rales  Cardiovascular:   S1/S2 normal, no  murmur/rub/gallop auscultated. RRR.  CXR images personally reviewed. ?increased inflammation main airways vs normal anatomy, no mass/infiltrate noted. Await radiology over-read.  No results found.   ASSESSMENT/PLAN: The primary encounter diagnosis was Cough. Diagnoses of Loose stools and Sinus congestion were also pertinent to this visit. Based on description of symptoms, sounds like viral upper respiratory infection which may have developed into a bacterial sinusitis, more recently may have additional viral illness given fever and loose stools.      Patient Instructions  Plan:   Treating as bacterial sinusitis causing cough, I suspect you've also picked up a new viral bronchitis which may be causing cough, fever, fatigue and loose stool.    Trial antibiotics, steroid pack and cough prescriptions.    Continue Atrovent nasal spray, try acetaminophen +/- ibuprofen for aches/pains. See below for other OTC meds and home remedies   Medications & Home Remedies for Respiratory Illness  Note: the following list assumes no pregnancy, normal liver & kidney function and no other drug interactions. Dr. Lyn Hollingshead has highlighted medications which are safe for you to use, but these may not be appropriate for everyone. Always ask a pharmacist or qualified medical provider if you have any questions!   Aches/Pains, Fever, Headache Acetaminophen (Tylenol) 500 mg tablets - take max 2 tablets (1000 mg) every 6 hours (4 times per day)  Ibuprofen (Motrin) 200 mg tablets - take max 4 tablets (800 mg) every 6 hours*  Sinus Congestion Prescription Atrovent as directed Nasal Saline if desired Oxymetolazone (Afrin, others) sparing use due to rebound congestion,  NEVER use in kids Phenylephrine (Sudafed) 10 mg tablets every 4 hours (or the 12-hour formulation)* Diphenhydramine (Benadryl) 25 mg tablets - take max 2 tablets every 4 hours  Cough & Sore Throat Prescription cough pills or syrups as  directed Dextromethorphan (Robitussin, others) - cough suppressant Guaifenesin (Robitussin, Mucinex, others) - expectorant (helps cough up mucus) (Dextromethorphan and Guaifenesin also come in a combination tablet) Lozenges w/ Benzocaine + Menthol (Cepacol) Honey - as much as you want! Teas which "coat the throat" - look for ingredients Elm Bark, Licorice Root, Marshmallow Root  Other Antibiotics if these are prescribed - take ALL, even if you're feeling better  Zinc Lozenges within 24 hours of symptoms onset - mixed evidence this shortens the duration of the common cold Don't waste your money on Vitamin C or Echinacea  *Caution in patients with high blood pressure       Visit summary was printed for the patient with medications and pertinent instructions for patient to review. ER/RTC precautions reviewed. All questions answered. Return if symptoms worsen or fail to improve.

## 2017-03-09 ENCOUNTER — Other Ambulatory Visit: Payer: Self-pay

## 2017-03-09 DIAGNOSIS — Z0000 Encounter for general adult medical examination without abnormal findings: Secondary | ICD-10-CM

## 2017-03-09 MED ORDER — ROSUVASTATIN CALCIUM 20 MG PO TABS: ORAL_TABLET | ORAL | 1 refills | Status: DC

## 2017-03-12 LAB — LIPID PANEL
CHOL/HDL RATIO: 3.9 (calc) (ref <5.0)
Cholesterol: 170 mg/dL (ref <200)
HDL: 44 mg/dL (ref >40)
LDL CHOLESTEROL (CALC): 107 mg/dL — AB
NON-HDL CHOLESTEROL (CALC): 126 mg/dL (ref <130)
TRIGLYCERIDES: 92 mg/dL (ref <150)

## 2017-03-12 LAB — CBC WITH DIFFERENTIAL/PLATELET
BASOS ABS: 31 {cells}/uL (ref 0–200)
BASOS PCT: 0.6 %
EOS ABS: 250 {cells}/uL (ref 15–500)
EOS PCT: 4.8 %
HCT: 41.3 % (ref 38.5–50.0)
HEMOGLOBIN: 14.3 g/dL (ref 13.2–17.1)
LYMPHS ABS: 1633 {cells}/uL (ref 850–3900)
MCH: 30.2 pg (ref 27.0–33.0)
MCHC: 34.6 g/dL (ref 32.0–36.0)
MCV: 87.3 fL (ref 80.0–100.0)
MONOS PCT: 10.3 %
MPV: 10.2 fL (ref 7.5–12.5)
NEUTROS PCT: 52.9 %
Neutro Abs: 2751 cells/uL (ref 1500–7800)
Platelets: 191 10*3/uL (ref 140–400)
RBC: 4.73 10*6/uL (ref 4.20–5.80)
RDW: 12.9 % (ref 11.0–15.0)
TOTAL LYMPHOCYTE: 31.4 %
WBC: 5.2 10*3/uL (ref 3.8–10.8)
WBCMIX: 536 {cells}/uL (ref 200–950)

## 2017-03-12 LAB — COMPLETE METABOLIC PANEL WITH GFR
AG Ratio: 2.4 (calc) (ref 1.0–2.5)
ALKALINE PHOSPHATASE (APISO): 47 U/L (ref 40–115)
ALT: 83 U/L — AB (ref 9–46)
AST: 36 U/L (ref 10–40)
Albumin: 4.6 g/dL (ref 3.6–5.1)
BILIRUBIN TOTAL: 1.3 mg/dL — AB (ref 0.2–1.2)
BUN: 15 mg/dL (ref 7–25)
CHLORIDE: 106 mmol/L (ref 98–110)
CO2: 29 mmol/L (ref 20–32)
Calcium: 9.5 mg/dL (ref 8.6–10.3)
Creat: 1.06 mg/dL (ref 0.60–1.35)
GFR, Est African American: 100 mL/min/{1.73_m2} (ref >=60)
GFR, Est Non African American: 86 mL/min/{1.73_m2} (ref >=60)
GLUCOSE: 97 mg/dL (ref 65–99)
Globulin: 1.9 g/dL (calc) (ref 1.9–3.7)
Potassium: 4.3 mmol/L (ref 3.5–5.3)
Sodium: 141 mmol/L (ref 135–146)
Total Protein: 6.5 g/dL (ref 6.1–8.1)

## 2017-03-12 LAB — PSA, TOTAL AND FREE
PSA, % Free: 27 % (calc) (ref >25)
PSA, Free: 0.3 ng/mL
PSA, Total: 1.1 ng/mL (ref <=4.0)

## 2017-03-12 LAB — TSH: TSH: 2.06 mIU/L (ref 0.40–4.50)

## 2017-03-16 ENCOUNTER — Encounter: Payer: Self-pay | Admitting: Family Medicine

## 2017-03-16 ENCOUNTER — Ambulatory Visit (INDEPENDENT_AMBULATORY_CARE_PROVIDER_SITE_OTHER): Payer: 59 | Admitting: Osteopathic Medicine

## 2017-03-16 ENCOUNTER — Encounter: Payer: Self-pay | Admitting: Osteopathic Medicine

## 2017-03-16 ENCOUNTER — Ambulatory Visit (INDEPENDENT_AMBULATORY_CARE_PROVIDER_SITE_OTHER): Payer: 59 | Admitting: Family Medicine

## 2017-03-16 VITALS — BP 137/83 | HR 59 | Wt 268.0 lb

## 2017-03-16 VITALS — BP 137/83 | HR 59 | Ht 72.0 in | Wt 268.0 lb

## 2017-03-16 DIAGNOSIS — R748 Abnormal levels of other serum enzymes: Secondary | ICD-10-CM | POA: Diagnosis not present

## 2017-03-16 DIAGNOSIS — Z0000 Encounter for general adult medical examination without abnormal findings: Secondary | ICD-10-CM

## 2017-03-16 DIAGNOSIS — M7711 Lateral epicondylitis, right elbow: Secondary | ICD-10-CM

## 2017-03-16 MED ORDER — ROSUVASTATIN CALCIUM 20 MG PO TABS: 10.0000 mg | ORAL_TABLET | Freq: Every day | ORAL | 3 refills | Status: DC

## 2017-03-16 MED ORDER — ROSUVASTATIN CALCIUM 20 MG PO TABS: ORAL_TABLET | ORAL | 11 refills | Status: DC

## 2017-03-16 MED ORDER — NITROGLYCERIN 0.2 MG/HR TD PT24: MEDICATED_PATCH | TRANSDERMAL | 1 refills | Status: DC

## 2017-03-16 NOTE — Progress Notes (Signed)
Shawn Richards is a 41 y.o. male who presents to [facility] today for elbow pain.  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 Patient reports receiving a steroid injection previously which relieved the pain for 6 months. Pain radiates to the shoulder. He denies any numbness or tingling. 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 He has tried exercises and stretches on his own, but this has not been helpful in relieving his pain.   Past Medical History:  Diagnosis Date  . Fatty liver   . Hyperlipidemia   . Hypertension 2006   treated for only 6 months  . Hypotestosteronism    Past Surgical History:  Procedure Laterality Date  . sinus polyps  1995  . TYMPANOSTOMY TUBE PLACEMENT     Social History  Substance Use Topics  . Smoking status: Never Smoker  . Smokeless tobacco: Never Used  . Alcohol use 1.0 oz/week    2 Standard drinks or equivalent per week     Comment: occasional     ROS:  As above   Medications: Current Outpatient Prescriptions  Medication Sig Dispense Refill  . rosuvastatin (CRESTOR) 20 MG tablet Take 0.5 tablets (10 mg total) by mouth daily. 45 tablet 3  . nitroGLYCERIN (NITRODUR - DOSED IN MG/24 HR) 0.2 mg/hr patch 1/4 patch to elbow daily for tendonitis 30 patch 1   No current facility-administered medications for this visit.    Allergies  Allergen Reactions  . Lipitor [Atorvastatin]     LFTs  . Zocor [Simvastatin]     LFTs     Exam:  BP 137/83   Pulse (!) 59   Wt 268 lb (121.6 kg)   BMI 36.35 kg/m  General: Well Developed, well nourished, and in no acute distress.  Neuro/Psych: Alert and oriented x3, extra-ocular muscles intact, able to move all 4 extremities, sensation grossly intact. Skin: Warm and dry, no rashes noted.  Respiratory: Not using accessory muscles,  speaking in full sentences, trachea midline.  Cardiovascular: Pulses palpable, no extremity edema. Abdomen: Does not appear distended. MSK: Right elbow: No gross deformity or edema on inspection Tenderness to deep palpation at lateral epicondylitis Range of motion is full with flexion, extension, abduction, adduction Strength is 5/5 with flexion and extension Maudsley test positive, Cozen test positive  Right shoulder: No gross deformity or edema on inspection No tenderness to palpation Range of motion is full and equal to opposite side Strength is 5/5 with abduction  Limited musculoskeletal ultrasound of the right lateral epicondyle shows an avulsion fragment at the tip of the lateral epicondyle. There is significant new vascularity at the superficial common extensor tendon insertion. No obvious defects. Consistent with lateral epicondylitis.    No results found for this or any previous visit (from the past 48 hour(s)). No results found.    Assessment and Plan: 41 y.o. male with right elbow pain. 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 Ultrasound was performed in clinic and showed signs of inflammation. There was no evidence of a tendon tear detected. Patient will try dedicated physical therapy and application of nitroglycerin patches directly to lateral epicondyle. The possibility of MRI and/or platelet-rich plasma injections were also discussed with the patient. He will follow-up in 6 weeks or sooner if pain worsens or does not improve.     Orders Placed This Encounter  Procedures  .  Ambulatory referral to Physical Therapy    Referral Priority:   Routine    Referral Type:   Physical Medicine    Referral Reason:   Specialty Services Required    Requested Specialty:   Physical Therapy    Number of Visits Requested:   1   Meds ordered this encounter  Medications  . nitroGLYCERIN (NITRODUR - DOSED IN MG/24 HR) 0.2 mg/hr patch    Sig: 1/4 patch to elbow  daily for tendonitis    Dispense:  30 patch    Refill:  1    Discussed warning signs or symptoms. Please see discharge instructions. Patient expresses understanding.  This patient was seen and interviewed and examined independently by myself. Clementeen Graham, MD

## 2017-03-16 NOTE — Progress Notes (Signed)
HPI: Shawn Richards is a 41 y.o. male  who presents to [facility] [facility] today, 03/16/17,  for chief complaint of:  Chief Complaint  Patient presents with  . Annual Exam  . Hyperlipidemia    Patient here for annual physical / wellness exam.  See preventive care reviewed as below.  Recent labs reviewed in detail with the patient.   Additional concerns today include:  None  Has appt w/ sports medicine Today for her shoulder issue.    Past medical, surgical, social and family history reviewed: Patient Active Problem List   Diagnosis Date Noted  . Lateral epicondylitis, right elbow 03/13/2016  . Nephrolithiasis 03/01/2013  . Erectile dysfunction 04/17/2011  . Fatigue 04/17/2011  . OTHER NONSPECIFIC ABNORMAL SERUM ENZYME LEVELS 03/06/2009  . ELEVATED BLOOD PRESSURE WITHOUT DIAGNOSIS OF HYPERTENSION 10/30/2008  . Hyperlipidemia 06/02/2007   Past Surgical History:  Procedure Laterality Date  . sinus polyps  1995  . TYMPANOSTOMY TUBE PLACEMENT     Social History  Substance Use Topics  . Smoking status: Never Smoker  . Smokeless tobacco: Never Used  . Alcohol use 1.0 oz/week    2 drink(s) per week     Comment: occasional   Family History  Problem Relation Age of Onset  . Cancer Father        prostate  . Hyperlipidemia Father   . Hyperlipidemia Mother      Current medication list and allergy/intolerance information reviewed:   Current Outpatient Prescriptions  Medication Sig Dispense Refill  . rosuvastatin (CRESTOR) 20 MG tablet TAKE 1 TABLET BY MOUTH ONCE A DAY 30 tablet 11   No current facility-administered medications for this visit.    Allergies  Allergen Reactions  . Lipitor [Atorvastatin]     LFTs  . Zocor [Simvastatin]     LFTs      Review of Systems:  Constitutional:  No  fever, no chills, No recent illness, No unintentional weight changes. + fatigue A bit worse than usual  HEENT: No  headache, no vision change, no  hearing change, No sore throat, No  sinus pressure. + Seasonal allergies but not too bothersome for him  Cardiac: No  chest pain, No  pressure, No palpitations,  Respiratory:  No  shortness of breath. No  Cough  Gastrointestinal: No  abdominal pain, No  nausea, No  vomiting,  No  blood in stool, No  diarrhea, No  constipation   Musculoskeletal: + new myalgia/arthralgia  Skin: No  Rash  Hem/Onc: No  easy bruising/bleeding,  Endocrine: No cold intolerance,  No heat intolerance  Neurologic: No  weakness, No  dizziness,  Psychiatric: No  concerns with depression, No  concerns with anxiety, No sleep problems, No mood problems  Exam:  BP 137/83   Pulse (!) 59   Ht 6' (1.829 m)   Wt 268 lb (121.6 kg)   BMI 36.35 kg/m   Constitutional: VS see above. General Appearance: alert, well-developed, well-nourished, NAD  Eyes: Normal lids and conjunctive, non-icteric sclera  Ears, Nose, Mouth, Throat: MMM, Normal external inspection ears/nares/mouth/lips/gums. TM normal bilaterally. Pharynx/tonsils no erythema, no exudate. Nasal mucosa normal.   Neck: No masses, trachea midline. No thyroid enlargement. No tenderness/mass appreciated. No lymphadenopathy  Respiratory: Normal respiratory effort. no wheeze, no rhonchi, no rales  Cardiovascular: S1/S2 normal, no murmur, no rub/gallop auscultated. RRR. No lower extremity edema.   Gastrointestinal: Nontender, no masses. No hepatomegaly, no splenomegaly. No hernia appreciated. Bowel sounds normal. Rectal exam deferred.  Musculoskeletal: Gait normal. No clubbing/cyanosis of digits.   Neurological: Normal balance/coordination. No tremor.   Skin: warm, dry, intact. No rash/ulcer.   Psychiatric: Normal judgment/insight. Normal mood and affect. Oriented x3.   Recent Results (from the past 2160 hour(s))  CBC with Differential/Platelet     Status: None   Collection Time: 03/11/17  8:11 AM  Result Value Ref Range   WBC 5.2 3.8 - 10.8  Thousand/uL   RBC 4.73 4.20 - 5.80 Million/uL   Hemoglobin 14.3 13.2 - 17.1 g/dL   HCT 16.1 09.6 - 04.5 %   MCV 87.3 80.0 - 100.0 fL   MCH 30.2 27.0 - 33.0 pg   MCHC 34.6 32.0 - 36.0 g/dL   RDW 40.9 81.1 - 91.4 %   Platelets 191 140 - 400 Thousand/uL   MPV 10.2 7.5 - 12.5 fL   Neutro Abs 2,751 1,500 - 7,800 cells/uL   Lymphs Abs 1,633 850 - 3,900 cells/uL   WBC mixed population 536 200 - 950 cells/uL   Eosinophils Absolute 250 15 - 500 cells/uL   Basophils Absolute 31 0 - 200 cells/uL   Neutrophils Relative % 52.9 %   Total Lymphocyte 31.4 %   Monocytes Relative 10.3 %   Eosinophils Relative 4.8 %   Basophils Relative 0.6 %  COMPLETE METABOLIC PANEL WITH GFR     Status: Abnormal   Collection Time: 03/11/17  8:11 AM  Result Value Ref Range   Glucose, Bld 97 65 - 99 mg/dL    Comment: .            Fasting reference interval .    BUN 15 7 - 25 mg/dL   Creat 7.82 9.56 - 2.13 mg/dL   GFR, Est Non African American 86 > OR = 60 mL/min/1.47m2   GFR, Est African American 100 > OR = 60 mL/min/1.59m2   BUN/Creatinine Ratio NOT APPLICABLE 6 - 22 (calc)   Sodium 141 135 - 146 mmol/L   Potassium 4.3 3.5 - 5.3 mmol/L   Chloride 106 98 - 110 mmol/L   CO2 29 20 - 32 mmol/L   Calcium 9.5 8.6 - 10.3 mg/dL   Total Protein 6.5 6.1 - 8.1 g/dL   Albumin 4.6 3.6 - 5.1 g/dL   Globulin 1.9 1.9 - 3.7 g/dL (calc)   AG Ratio 2.4 1.0 - 2.5 (calc)   Total Bilirubin 1.3 (H) 0.2 - 1.2 mg/dL   Alkaline phosphatase (APISO) 47 40 - 115 U/L   AST 36 10 - 40 U/L   ALT 83 (H) 9 - 46 U/L  PSA, total and free     Status: None   Collection Time: 03/11/17  8:11 AM  Result Value Ref Range   PSA, Total 1.1 < OR = 4.0 ng/mL   PSA, Free 0.3 ng/mL   PSA, % Free 27 >25 % (calc)    Comment: . PSA(ng/mL)      Free PSA(%)     Estimated(x) Probability                                      of Cancer(as%) 0-2.5              (*)               Approx. 1 2.6-4.0(1)         0-27(2)  24(3) 4.1-10(4)           0-10                      56                    11-15                     28                    16-20                     20                    21-25                     16                    >or =26                   8 >10(+)             N/A                      >50 . References:(1)Catalona et al.:Urology 60: 469-474 (2002)            (2)Catalona et al.:J.Urol 168: 922-925 (2002)               Free PSA(%)   Sensitivity(%)  Specificity(%)               < or = 25          85              19               < or = 30          93               9            (3)Catalona et al.:JAMA 277: 1452-1455 (1997)            (4)Catalona et al.:JAMA 279: 5621-3086 (1998) . (x)These estimates vary with age, ethnicity, family     history and DRE results. (*)The  diagnostic usefulness of % Free PSA has not been    established in patients with total PSA below 2.6 ng/mL (+)In men with PSA above 10 ng/mL, prostate cancer risk is    determined by total PSA alone. . The Total PSA value from this assay system is  standardized against the equimolar PSA standard.  The test result will be approximately 20% higher  when compared to the [facility] Total PSA  (Siemens assay). Comparison of serial PSA results  should be interpreted with this fact in mind. Marland Kitchen PSA was performed using the Beckman Coulter Immunoassay method. Values obtained from different assay methods cannot be used interchangeably. PSA levels, regardless of value, should not be interpreted as absolute evidence of the presence or absence of disease. .   TSH     Status: None   Collection Time: 03/11/17  8:11 AM  Result Value Ref Range   TSH 2.06 0.40 - 4.50 mIU/L  Lipid panel     Status: Abnormal   Collection Time: 03/11/17  8:11 AM  Result Value Ref Range  Cholesterol 170 <200 mg/dL   HDL 44 >16 mg/dL   Triglycerides 92 <109 mg/dL   LDL Cholesterol (Calc) 107 (H) mg/dL (calc)    Comment: Reference range: <100 . Desirable range  <100 mg/dL for primary prevention;   <70 mg/dL for patients with CHD or diabetic patients  with > or = 2 CHD risk factors. Marland Kitchen LDL-C is now calculated using the Martin-Hopkins  calculation, which is a validated novel method providing  better accuracy than the Friedewald equation in the  estimation of LDL-C.  Horald Pollen et al. Lenox Ahr. 6045;409(81): 2061-2068  (http://education.[facility].com/faq/FAQ164)    Total CHOL/HDL Ratio 3.9 <5.0 (calc)   Non-HDL Cholesterol (Calc) 126 <130 mg/dL (calc)    Comment: For patients with diabetes plus 1 major ASCVD risk  factor, treating to a non-HDL-C goal of <100 mg/dL  (LDL-C of <19 mg/dL) is considered a therapeutic  option.      ASSESSMENT/PLAN:   Annual physical exam - No major can earns on labs other than elevated liver enzymes, chronic issue that the statin therapy related. Consider switch to pravastatin, follow labs  Elevated liver enzymes - Plan: COMPLETE METABOLIC PANEL WITH GFR, Lipid panel   MALE PREVENTIVE CARE  updated 03/16/17  ANNUAL SCREENING/COUNSELING  Any changes to health in the past year? no  Diet/Exercise - HEALTHY HABITS DISCUSSED TO DECREASE CV RISK History  Smoking Status  . Never Smoker  Smokeless Tobacco  . Never Used   History  Alcohol Use  . 1.0 oz/week  . 2 drink(s) per week    Comment: occasional   Depression screen [facility] 2/9 03/16/2017  Decreased Interest 0  Down, Depressed, Hopeless 0  PHQ - 2 Score 0    SEXUAL/REPRODUCTIVE HEALTH  Sexually active in the past year? - Yes with male.  STI testing needed/desired today? - no  Any concerns with testosterone/libido? - no  INFECTIOUS DISEASE SCREENING  HIV - needs - declined   GC/CT - does not need  HepC - does not need  TB - does not need  CANCER SCREENING  Lung - does not need  Colon - does not need  Prostate - does not need  OTHER DISEASE SCREENING  Lipid - does not need  DM2 - does not need  AAA -  does not  need  Osteoporosis - men 42yo+ - does not need  ADULT VACCINATION  Influenza - annual vaccine recommended  Td - booster every 10 years   Zoster - option at 8, yes at 60+   PCV13 - was not indicated  PPSV23 - was not indicated  There is no immunization history on file for this patient.  OTHER  Fall - exercise and Vit D age 84+ - does not need  Consider ASA - age 80-59 - does not need     Visit summary with medication list and pertinent instructions was printed for patient to review. All questions at time of visit were answered - patient instructed to contact office with any additional concerns. ER/RTC precautions were reviewed with the patient. Follow-up plan: Return in about 6 months (around 09/13/2017) for LABS ONLY - and repeat annual physical approx 02/2018 w/ Dr Lyn Hollingshead .

## 2017-03-16 NOTE — Patient Instructions (Signed)
Thank you for coming in today. Apply the Nitropatches as directed.  Attend Hand PT.  Recheck with me in 6 weeks or sooner if needed.   Nitroglycerin Protocol   Apply 1/4 nitroglycerin patch to affected area daily.  Change position of patch within the affected area every 24 hours.  You may experience a headache during the first 1-2 weeks of using the patch, these should subside.  If you experience headaches after beginning nitroglycerin patch treatment, you may take your preferred over the counter pain reliever.  Another side effect of the nitroglycerin patch is skin irritation or rash related to patch adhesive.  Please notify our office if you develop more severe headaches or rash, and stop the patch.  Tendon healing with nitroglycerin patch may require 12 to 24 weeks depending on the extent of injury.  Men should not use if taking Viagra, Cialis, or Levitra.   Do not use if you have migraines or rosacea.    Tennis Elbow Tennis elbow (lateral epicondylitis) is inflammation of the outer tendons of your forearm close to your elbow. Your tendons attach your muscles to your bones. The outer tendons of your forearm are used to extend your wrist, and they attach on the outside part of your elbow. Tennis elbow is often found in people who play tennis, but anyone may get the condition from repeatedly extending the wrist or turning the forearm. What are the causes? This condition is caused by repeatedly extending your wrist and using your hands. It can result from sports or work that requires repetitive forearm movements. Tennis elbow may also be caused by an injury. What increases the risk? You have a higher risk of developing tennis elbow if you play tennis or another racquet sport. You also have a higher risk if you frequently use your hands for work. This condition is also more likely to develop in:  Musicians.  Carpenters, painters, and plumbers.  Cooks.  Cashiers.  People who  work in [employer].  Holiday representative workers.  Butchers.  People who use computers.  What are the signs or symptoms? Symptoms of this condition include:  Pain and tenderness in your forearm and the outer part of your elbow. You may only feel the pain when you use your arm, or you may feel it even when you are not using your arm.  A burning feeling that runs from your elbow through your arm.  Weak grip in your hands.  How is this diagnosed? This condition may be diagnosed by medical history and physical exam. You may also have other tests, including:  X-rays.  MRI.  How is this treated? Your health care provider will recommend lifestyle adjustments, such as resting and icing your arm. Treatment may also include:  Medicines for inflammation. This may include shots of cortisone if your pain continues.  Physical therapy. This may include massage or exercises.  An elbow brace.  Surgery may eventually be recommended if your pain does not go away with treatment. Follow these instructions at home: Activity  Rest your elbow and wrist as directed by your health care provider. Try to avoid any activities that caused the problem until your health care provider says that you can do them again.  If a physical therapist teaches you exercises, do all of them as directed.  If you lift an object, lift it with your palm facing upward. This lowers the stress on your elbow. Lifestyle  If your tennis elbow is caused by sports, check your equipment and  make sure that: ? You are using it correctly. ? It is the best fit for you.  If your tennis elbow is caused by work, take breaks frequently, if you are able. Talk with your manager about how to best perform tasks in a way that is safe. ? If your tennis elbow is caused by computer use, talk with your manager about any changes that can be made to your work environment. General instructions  If directed, apply ice to the painful area: ? Put ice  in a plastic bag. ? Place a towel between your skin and the bag. ? Leave the ice on for 20 minutes, 2-3 times per day.  Take medicines only as directed by your health care provider.  If you were given a brace, wear it as directed by your health care provider.  Keep all follow-up visits as directed by your health care provider. This is important. Contact a health care provider if:  Your pain does not get better with treatment.  Your pain gets worse.  You have numbness or weakness in your forearm, hand, or fingers. This information is not intended to replace advice given to you by your health care provider. Make sure you discuss any questions you have with your health care provider. Document Released: 06/09/2005 Document Revised: 02/07/2016 Document Reviewed: 06/05/2014 Elsevier Interactive Patient Education  [employer].

## 2017-09-17 ENCOUNTER — Telehealth: Payer: Self-pay | Admitting: Osteopathic Medicine

## 2017-09-17 NOTE — Telephone Encounter (Signed)
Patient will bring an updated insurance card to the office since the one on file had changed.

## 2017-09-18 NOTE — Telephone Encounter (Addendum)
Patient called back and he will get labs on Monday(09/21/2017) morning and patient is aware our office will call him with results so he can receive his medication.

## 2017-09-18 NOTE — Telephone Encounter (Signed)
Per last note the patient is due for fasting labs before Crestor can be sent to the pharmacy. Left message for patient to call back.

## 2017-09-21 LAB — COMPLETE METABOLIC PANEL WITH GFR
AG Ratio: 2.4 (calc) (ref 1.0–2.5)
ALT: 59 U/L — ABNORMAL HIGH (ref 9–46)
AST: 38 U/L (ref 10–40)
Albumin: 4.5 g/dL (ref 3.6–5.1)
Alkaline phosphatase (APISO): 49 U/L (ref 40–115)
BILIRUBIN TOTAL: 1.5 mg/dL — AB (ref 0.2–1.2)
BUN: 18 mg/dL (ref 7–25)
CALCIUM: 9.5 mg/dL (ref 8.6–10.3)
CHLORIDE: 107 mmol/L (ref 98–110)
CO2: 29 mmol/L (ref 20–32)
Creat: 1.12 mg/dL (ref 0.60–1.35)
GFR, EST AFRICAN AMERICAN: 93 mL/min/{1.73_m2} (ref >=60)
GFR, EST NON AFRICAN AMERICAN: 80 mL/min/{1.73_m2} (ref >=60)
GLOBULIN: 1.9 g/dL (ref 1.9–3.7)
Glucose, Bld: 97 mg/dL (ref 65–99)
POTASSIUM: 4.5 mmol/L (ref 3.5–5.3)
SODIUM: 142 mmol/L (ref 135–146)
TOTAL PROTEIN: 6.4 g/dL (ref 6.1–8.1)

## 2017-09-21 LAB — LIPID PANEL
Cholesterol: 193 mg/dL (ref <200)
HDL: 37 mg/dL — ABNORMAL LOW (ref >40)
LDL Cholesterol (Calc): 138 mg/dL (calc) — ABNORMAL HIGH
NON-HDL CHOLESTEROL (CALC): 156 mg/dL — AB (ref <130)
Total CHOL/HDL Ratio: 5.2 (calc) — ABNORMAL HIGH (ref <5.0)
Triglycerides: 82 mg/dL (ref <150)

## 2017-09-25 ENCOUNTER — Other Ambulatory Visit: Payer: Self-pay | Admitting: Family Medicine

## 2017-09-25 ENCOUNTER — Other Ambulatory Visit: Payer: Self-pay

## 2017-09-25 DIAGNOSIS — R748 Abnormal levels of other serum enzymes: Secondary | ICD-10-CM

## 2017-09-25 MED ORDER — ROSUVASTATIN CALCIUM 20 MG PO TABS: 10.0000 mg | ORAL_TABLET | Freq: Every day | ORAL | 3 refills | Status: DC

## 2017-09-28 MED ORDER — ROSUVASTATIN CALCIUM 20 MG PO TABS: 10.0000 mg | ORAL_TABLET | Freq: Every day | ORAL | 3 refills | Status: DC

## 2017-11-25 ENCOUNTER — Ambulatory Visit
Admission: RE | Admit: 2017-11-25 | Discharge: 2017-11-25 | Disposition: A | Discharge status: Home / Self Care | Payer: BLUE CROSS/BLUE SHIELD | Source: Ambulatory Visit | Attending: Family Medicine | Admitting: Family Medicine

## 2017-11-25 DIAGNOSIS — R7989 Other specified abnormal findings of blood chemistry: Secondary | ICD-10-CM | POA: Diagnosis not present

## 2017-11-25 DIAGNOSIS — R748 Abnormal levels of other serum enzymes: Secondary | ICD-10-CM

## 2018-10-08 ENCOUNTER — Other Ambulatory Visit: Payer: Self-pay | Admitting: Osteopathic Medicine

## 2018-10-12 ENCOUNTER — Telehealth: Payer: Self-pay | Admitting: Osteopathic Medicine

## 2018-10-12 MED ORDER — ROSUVASTATIN CALCIUM 20 MG PO TABS: 10.0000 mg | ORAL_TABLET | Freq: Every day | ORAL | 0 refills | Status: DC

## 2018-10-12 NOTE — Telephone Encounter (Signed)
Patient would like a refill for rosuvastatin (CRESTOR) 20 MG tablet [[id number]] sent to [id number] - [place], [place] - [address] Patient was last seen  03/16/17.

## 2018-10-12 NOTE — Telephone Encounter (Signed)
Let patient know about this and he stated he would call back to schedule an OV once this is all over. No further questions at this time.

## 2018-10-12 NOTE — Telephone Encounter (Signed)
Sending one month refill. Pt needs OV follow up and labs.

## 2018-10-12 NOTE — Telephone Encounter (Signed)
Left a message for patient to call back to schedule an appointment.

## 2018-11-16 ENCOUNTER — Other Ambulatory Visit: Payer: Self-pay | Admitting: Osteopathic Medicine

## 2018-12-06 ENCOUNTER — Other Ambulatory Visit: Payer: Self-pay | Admitting: Osteopathic Medicine

## 2018-12-15 ENCOUNTER — Telehealth: Payer: Self-pay

## 2018-12-15 DIAGNOSIS — Z0000 Encounter for general adult medical examination without abnormal findings: Secondary | ICD-10-CM

## 2018-12-15 DIAGNOSIS — E785 Hyperlipidemia, unspecified: Secondary | ICD-10-CM

## 2018-12-15 MED ORDER — ROSUVASTATIN CALCIUM 20 MG PO TABS: 10.0000 mg | ORAL_TABLET | Freq: Every day | ORAL | 0 refills | Status: DC

## 2018-12-15 NOTE — Telephone Encounter (Signed)
Patient advised.

## 2018-12-15 NOTE — Telephone Encounter (Signed)
Ok to fill 30 days Rx. I haven't seen him since 2018 I don't know how he's been getting refills this long anyway. 10 days off meds shouldn't make a huge difference, can get labs anytime - orders are in

## 2018-12-15 NOTE — Telephone Encounter (Signed)
Called and left message with recommendations. Refilled medication.

## 2018-12-15 NOTE — Telephone Encounter (Signed)
Shawn Richards called and states he doesn't want to come in for a visit until he has lab work. He states he has been off the cholesterol medication for 10 days and wanted to know how long he will need to be back on the medication before doing the lab draw. How long and how many tablets to refill?

## 2018-12-18 LAB — CBC
HCT: 43 % (ref 38.5–50.0)
Hemoglobin: 14.2 g/dL (ref 13.2–17.1)
MCH: 29.6 pg (ref 27.0–33.0)
MCHC: 33 g/dL (ref 32.0–36.0)
MCV: 89.6 fL (ref 80.0–100.0)
MPV: 10 fL (ref 7.5–12.5)
Platelets: 220 10*3/uL (ref 140–400)
RBC: 4.8 10*6/uL (ref 4.20–5.80)
RDW: 12.7 % (ref 11.0–15.0)
WBC: 4.4 10*3/uL (ref 3.8–10.8)

## 2018-12-18 LAB — COMPLETE METABOLIC PANEL WITH GFR
AG Ratio: 2.2 (calc) (ref 1.0–2.5)
ALT: 60 U/L — ABNORMAL HIGH (ref 9–46)
AST: 29 U/L (ref 10–40)
Albumin: 4.4 g/dL (ref 3.6–5.1)
Alkaline phosphatase (APISO): 44 U/L (ref 36–130)
BUN: 15 mg/dL (ref 7–25)
CO2: 26 mmol/L (ref 20–32)
Calcium: 9.7 mg/dL (ref 8.6–10.3)
Chloride: 105 mmol/L (ref 98–110)
Creat: 1.08 mg/dL (ref 0.60–1.35)
GFR, Est African American: 96 mL/min/{1.73_m2} (ref >=60)
GFR, Est Non African American: 83 mL/min/{1.73_m2} (ref >=60)
Globulin: 2 g/dL (calc) (ref 1.9–3.7)
Glucose, Bld: 87 mg/dL (ref 65–99)
Potassium: 4.6 mmol/L (ref 3.5–5.3)
Sodium: 140 mmol/L (ref 135–146)
Total Bilirubin: 1.1 mg/dL (ref 0.2–1.2)
Total Protein: 6.4 g/dL (ref 6.1–8.1)

## 2018-12-18 LAB — LIPID PANEL
Cholesterol: 286 mg/dL — ABNORMAL HIGH (ref <200)
HDL: 41 mg/dL (ref >=40)
LDL Cholesterol (Calc): 214 mg/dL (calc) — ABNORMAL HIGH
Non-HDL Cholesterol (Calc): 245 mg/dL (calc) — ABNORMAL HIGH (ref <130)
Total CHOL/HDL Ratio: 7 (calc) — ABNORMAL HIGH (ref <5.0)
Triglycerides: 147 mg/dL (ref <150)

## 2018-12-22 ENCOUNTER — Ambulatory Visit (INDEPENDENT_AMBULATORY_CARE_PROVIDER_SITE_OTHER): Payer: BC Managed Care – PPO | Admitting: Osteopathic Medicine

## 2018-12-22 ENCOUNTER — Encounter: Payer: Self-pay | Admitting: Osteopathic Medicine

## 2018-12-22 VITALS — Wt 265.0 lb

## 2018-12-22 DIAGNOSIS — E782 Mixed hyperlipidemia: Secondary | ICD-10-CM

## 2018-12-22 DIAGNOSIS — R748 Abnormal levels of other serum enzymes: Secondary | ICD-10-CM | POA: Diagnosis not present

## 2018-12-22 DIAGNOSIS — Z8042 Family history of malignant neoplasm of prostate: Secondary | ICD-10-CM | POA: Insufficient documentation

## 2018-12-22 NOTE — Progress Notes (Signed)
Virtual Visit via Video (App used: Doximity) Note  I connected with      Shawn Richards on 12/22/18 at 10:50 by a telemedicine application and verified that I am speaking with the correct person using two identifiers.  Patient is in his car I am in office    I discussed the limitations of evaluation and management by telemedicine and the availability of in person appointments. The patient expressed understanding and agreed to proceed.  History of Present Illness: Shawn Richards is a 43 y.o. male who would like to discuss lab results   Last visit with me was 02/2017 Pt got labs done recently, reviewed these as below.   Elevated liver enzymes: AST has been WNL ALT has been around 60 past few checks   HLD: LDL was 214 on recent check Taking rosuvastatin 10 mg daily but was off for about 10 days prior to blood draw d/t out of medications    Recent Results (from the past 2160 hour(s))  CBC     Status: None   Collection Time: 12/17/18  8:23 AM  Result Value Ref Range   WBC 4.4 3.8 - 10.8 Thousand/uL   RBC 4.80 4.20 - 5.80 Million/uL   Hemoglobin 14.2 13.2 - 17.1 g/dL   HCT 43.0 38.5 - 50.0 %   MCV 89.6 80.0 - 100.0 fL   MCH 29.6 27.0 - 33.0 pg   MCHC 33.0 32.0 - 36.0 g/dL   RDW 12.7 11.0 - 15.0 %   Platelets 220 140 - 400 Thousand/uL   MPV 10.0 7.5 - 12.5 fL  COMPLETE METABOLIC PANEL WITH GFR     Status: Abnormal   Collection Time: 12/17/18  8:23 AM  Result Value Ref Range   Glucose, Bld 87 65 - 99 mg/dL    Comment: .            Fasting reference interval .    BUN 15 7 - 25 mg/dL   Creat 1.08 0.60 - 1.35 mg/dL   GFR, Est Non African American 83 > OR = 60 mL/min/1.40m2   GFR, Est African American 96 > OR = 60 mL/min/1.31m2   BUN/Creatinine Ratio NOT APPLICABLE 6 - 22 (calc)   Sodium 140 135 - 146 mmol/L   Potassium 4.6 3.5 - 5.3 mmol/L   Chloride 105 98 - 110 mmol/L   CO2 26 20 - 32 mmol/L   Calcium 9.7 8.6 - 10.3 mg/dL   Total Protein 6.4 6.1 - 8.1 g/dL    Albumin 4.4 3.6 - 5.1 g/dL   Globulin 2.0 1.9 - 3.7 g/dL (calc)   AG Ratio 2.2 1.0 - 2.5 (calc)   Total Bilirubin 1.1 0.2 - 1.2 mg/dL   Alkaline phosphatase (APISO) 44 36 - 130 U/L   AST 29 10 - 40 U/L   ALT 60 (H) 9 - 46 U/L  Lipid panel     Status: Abnormal   Collection Time: 12/17/18  8:23 AM  Result Value Ref Range   Cholesterol 286 (H) <200 mg/dL   HDL 41 > OR = 40 mg/dL   Triglycerides 147 <150 mg/dL   LDL Cholesterol (Calc) 214 (H) mg/dL (calc)    Comment: LDL-C levels > or = 190 mg/dL may indicate familial  hypercholesterolemia (FH). Clinical assessment and  measurement of blood lipid levels should be  considered for all first degree relatives of  patients with an FH diagnosis.  For questions about testing for familial hypercholesterolemia, please call [employer]  Client Services at [email or web address]. Wardell HonourJacobson T, et al. J National Lipid Association  Recommendations for Patient-Centered Management of  Dyslipidemia: Part 1 Journal of Clinical Lipidology  2015;9(2), 129-169. Reference range: <100 . Desirable range <100 mg/dL for primary prevention;   <70 mg/dL for patients with CHD or diabetic patients  with > or = 2 CHD risk factors. Marland Kitchen. LDL-C is now calculated using the Martin-Hopkins  calculation, which is a validated novel method providing  better accuracy than the Friedewald equation in the  estimation of LDL-C.  Horald PollenMartin SS et al. Lenox AhrJAMA. 4098;119(142013;310(19): 2061-2068  (http://education.[facility].com/faq/FAQ164)    Total CHOL/HDL Ratio 7.0 (H) <5.0 (calc)   Non-HDL Cholesterol (Calc) 245 (H) <130 mg/dL (calc)    Comment: Non-HDL level > or = 220 is very high and may indicate  genetic familial hypercholesterolemia (FH). Clinical  assessment and measurement of blood lipid levels  should be considered for all first-degree relatives  of patients with an FH diagnosis. . For patients with diabetes plus 1 major ASCVD risk  factor, treating to a non-HDL-C goal of  <100 mg/dL  (LDL-C of <78<70 mg/dL) is considered a therapeutic  option.         Observations/Objective: Wt 265 lb (120.2 kg)   BMI 35.94 kg/m  BP Readings from Last 3 Encounters:  03/16/17 137/83  03/16/17 137/83  10/01/16 128/65   Exam: Normal Speech.  NAD Lab and Radiology Results No results found for this or any previous visit (from the past 72 hour(s)). No results found.     Assessment and Plan: 43 y.o. male with The primary encounter diagnosis was Mixed hyperlipidemia. Diagnoses of Elevated liver enzymes and Family history of prostate cancer in father were also pertinent to this visit.   PDMP not reviewed this encounter. Orders Placed This Encounter  Procedures  . Lipid panel  . Hepatic function panel  . PSA, Total with Reflex to PSA, Free   No orders of the defined types were placed in this encounter.  There are no Patient Instructions on file for this visit.  Instructions sent via MyChart. If MyChart not available, pt was given option for info via personal e-mail w/ no guarantee of protected health info over unsecured e-mail communication, and MyChart sign-up instructions were included.   Follow Up Instructions: Return for RECHECK PENDING RESULTS / LAB VISIT ONLY 1ST WEEK OF AUGUST .    I discussed the assessment and treatment plan with the patient. The patient was provided an opportunity to ask questions and all were answered. The patient agreed with the plan and demonstrated an understanding of the instructions.   The patient was advised to call back or seek an in-person evaluation if any new concerns, if symptoms worsen or if the condition fails to improve as anticipated.  25 minutes of non-face-to-face time was provided during this encounter.                      Historical information moved to improve visibility of documentation.  Past Medical History:  Diagnosis Date  . Fatty liver   . Hyperlipidemia   . Hypertension 2006    treated for only 6 months  . Hypotestosteronism    Past Surgical History:  Procedure Laterality Date  . sinus polyps  1995  . TYMPANOSTOMY TUBE PLACEMENT     Social History   Tobacco Use  . Smoking status: Never Smoker  . Smokeless tobacco: Never Used  Substance Use Topics  . Alcohol use: Yes  Alcohol/week: 2.0 standard drinks    Types: 2 Standard drinks or equivalent per week    Comment: occasional   family history includes Cancer in his father; Dementia in his father; Hyperlipidemia in his father and mother.  Medications: Current Outpatient Medications  Medication Sig Dispense Refill  . rosuvastatin (CRESTOR) 20 MG tablet Take 0.5 tablets (10 mg total) by mouth daily. Needs appt, can do virtual 30 tablet 0  . Meth-Hyo-M Bl-Na Phos-Ph Sal (URIBEL) 118 MG CAPS Take by mouth.    Marland Kitchen. omeprazole (PRILOSEC) 40 MG capsule TAKE 1 CAPSULE BY MOUTH ONCE A DAY     No current facility-administered medications for this visit.    Allergies  Allergen Reactions  . Lipitor [Atorvastatin]     LFTs  . Zocor [Simvastatin]     LFTs    PDMP not reviewed this encounter. Orders Placed This Encounter  Procedures  . Lipid panel  . Hepatic function panel  . PSA, Total with Reflex to PSA, Free   No orders of the defined types were placed in this encounter.

## 2019-02-11 ENCOUNTER — Other Ambulatory Visit: Payer: Self-pay | Admitting: Osteopathic Medicine

## 2019-03-02 ENCOUNTER — Telehealth: Payer: Self-pay | Admitting: Osteopathic Medicine

## 2019-03-02 NOTE — Telephone Encounter (Signed)
Patient will need in office appointment with PCP. Please call and schedule. Thanks!

## 2019-03-02 NOTE — Telephone Encounter (Signed)
Patient called, states he is having snoring issues and they are continuing to get worse. Didn't know if he needed an appointment to be seen for this issue or to be referred for a sleep study. Please Advise.

## 2019-03-09 ENCOUNTER — Encounter: Payer: Self-pay | Admitting: Osteopathic Medicine

## 2019-03-09 ENCOUNTER — Ambulatory Visit (INDEPENDENT_AMBULATORY_CARE_PROVIDER_SITE_OTHER): Payer: BC Managed Care – PPO | Admitting: Osteopathic Medicine

## 2019-03-09 VITALS — Wt 265.0 lb

## 2019-03-09 DIAGNOSIS — Z9189 Other specified personal risk factors, not elsewhere classified: Secondary | ICD-10-CM

## 2019-03-09 DIAGNOSIS — R0683 Snoring: Secondary | ICD-10-CM

## 2019-03-09 DIAGNOSIS — Z8679 Personal history of other diseases of the circulatory system: Secondary | ICD-10-CM | POA: Diagnosis not present

## 2019-03-09 DIAGNOSIS — R5382 Chronic fatigue, unspecified: Secondary | ICD-10-CM

## 2019-03-09 NOTE — Progress Notes (Signed)
Virtual Visit via Video (App used: Doximity) Note  I connected with      Shawn BenchMatthew J Richards on 03/09/19 at 9:17 AM  by a telemedicine application and verified that I am speaking with the correct person using two identifiers.  Patient is at home I am in office   I discussed the limitations of evaluation and management by telemedicine and the availability of in person appointments. The patient expressed understanding and agreed to proceed.  History of Present Illness: Shawn Richards is a 43 y.o. male who would like to discuss snoring   Snoring really loudly Ongoing for years, worse past few months  Wife is bothered He is feeling tired throughout the day  STOP-BANG for SLEEP APNEA Do you Snore loudly? Yes Do you often feel Tired during day? Yes Has anyone Observed you stop breathing? No History of high blood Pressure? Yes BMI >35? No Age >50? No Neck circumference >16 in? Yes Gender male? Yes 5-8 = high risk 3-4 = intermediate 0-2 = low risk   BP Readings from Last 3 Encounters:  03/16/17 137/83  03/16/17 137/83  10/01/16 128/65      Observations/Objective: Wt 265 lb (120.2 kg)   BMI 35.94 kg/m  BP Readings from Last 3 Encounters:  03/16/17 137/83  03/16/17 137/83  10/01/16 128/65   Exam: Normal Speech.  NAD  Lab and Radiology Results No results found for this or any previous visit (from the past 72 hour(s)). No results found.     Assessment and Plan: 43 y.o. male with The primary encounter diagnosis was At risk for obstructive sleep apnea. Diagnoses of History of essential hypertension, Snoring, and Chronic fatigue were also pertinent to this visit.   PDMP not reviewed this encounter. Orders Placed This Encounter  Procedures  . Home sleep test    Order Specific Question:   Where should this test be performed:    Answer:   Other     Follow Up Instructions: Return for RECHECK PENDING RESULTS / IF WORSE OR CHANGE.    I discussed the  assessment and treatment plan with the patient. The patient was provided an opportunity to ask questions and all were answered. The patient agreed with the plan and demonstrated an understanding of the instructions.   The patient was advised to call back or seek an in-person evaluation if any new concerns, if symptoms worsen or if the condition fails to improve as anticipated.  15 minutes of non-face-to-face time was provided during this encounter.                      Historical information moved to improve visibility of documentation.  Past Medical History:  Diagnosis Date  . Fatty liver   . Hyperlipidemia   . Hypertension 2006   treated for only 6 months  . Hypotestosteronism    Past Surgical History:  Procedure Laterality Date  . sinus polyps  1995  . TYMPANOSTOMY TUBE PLACEMENT     Social History   Tobacco Use  . Smoking status: Never Smoker  . Smokeless tobacco: Never Used  Substance Use Topics  . Alcohol use: Yes    Alcohol/week: 2.0 standard drinks    Types: 2 Standard drinks or equivalent per week    Comment: occasional   family history includes Cancer in his father; Dementia in his father; Hyperlipidemia in his father and mother.  Medications: Current Outpatient Medications  Medication Sig Dispense Refill  . Meth-Hyo-M Bl-Na Phos-Ph  Sal (URIBEL) 118 MG CAPS Take by mouth.    Marland Kitchen omeprazole (PRILOSEC) 40 MG capsule TAKE 1 CAPSULE BY MOUTH ONCE A DAY    . rosuvastatin (CRESTOR) 20 MG tablet take one-half tablet by mouth daily. **needs appt, can do virtual** 30 tablet 0   No current facility-administered medications for this visit.    Allergies  Allergen Reactions  . Lipitor [Atorvastatin]     LFTs  . Zocor [Simvastatin]     LFTs    PDMP not reviewed this encounter. Orders Placed This Encounter  Procedures  . Home sleep test    Order Specific Question:   Where should this test be performed:    Answer:   Other   No orders of the defined  types were placed in this encounter.

## 2019-04-11 ENCOUNTER — Other Ambulatory Visit: Payer: Self-pay | Admitting: Osteopathic Medicine

## 2019-04-11 NOTE — Telephone Encounter (Signed)
Requested medication (s) are due for refill today: yes  Requested medication (s) are on the active medication list: yes  Last refill:  02/11/2019  Future visit scheduled: no  Notes to clinic:  Review for refill   Requested Prescriptions  Pending Prescriptions Disp Refills   rosuvastatin (CRESTOR) 20 MG tablet [Pharmacy Med Name: Rosuvastatin Calcium Oral Tablet 20 MG] 30 tablet 0    Sig: TAKE ONE-HALF TABLET BY MOUTH ONE TIME DAILY  *NEED AN APPOINTMENT, CAN DO VIRTUAL*     Cardiovascular:  Antilipid - Statins Failed - 04/11/2019  7:35 AM      Failed - Total Cholesterol in normal range and within 360 days    Cholesterol  Date Value Ref Range Status  12/17/2018 286 (H) <200 mg/dL Final         Failed - LDL in normal range and within 360 days    LDL Cholesterol (Calc)  Date Value Ref Range Status  12/17/2018 214 (H) mg/dL (calc) Final    Comment:    LDL-C levels > or = 190 mg/dL may indicate familial  hypercholesterolemia (FH). Clinical assessment and  measurement of blood lipid levels should be  considered for all first degree relatives of  patients with an FH diagnosis.  For questions about testing for familial hypercholesterolemia, please call Insurance risk surveyor at [email or web address]. Duncan Dull, et al. J National Lipid Association  Recommendations for Patient-Centered Management of  Dyslipidemia: Part 1 Journal of Clinical Lipidology  2015;9(2), 129-169. Reference range: <100 . Desirable range <100 mg/dL for primary prevention;   <70 mg/dL for patients with CHD or diabetic patients  with > or = 2 CHD risk factors. Marland Kitchen LDL-C is now calculated using the Martin-Hopkins  calculation, which is a validated novel method providing  better accuracy than the Friedewald equation in the  estimation of LDL-C.  Cresenciano Genre et al. Annamaria Helling. 7858;850(27): 2061-2068  (http://education.[facility].com/faq/FAQ164)          Passed - HDL in normal range and within 360 days     HDL  Date Value Ref Range Status  12/17/2018 41 > OR = 40 mg/dL Final         Passed - Triglycerides in normal range and within 360 days    Triglycerides  Date Value Ref Range Status  12/17/2018 147 <150 mg/dL Final         Passed - Patient is not pregnant      Passed - Valid encounter within last 12 months    Recent Outpatient Visits          1 month ago At risk for obstructive sleep apnea   [place], Lanelle Bal, DO   3 months ago Mixed hyperlipidemia   [facility], Lanelle Bal, DO   2 years ago Lateral epicondylitis, right elbow   [place], Rebekah [place], MD   2 years ago Annual physical exam   [place], Lanelle Bal, DO   2 years ago Cough   [place], Waller, DO

## 2019-05-16 ENCOUNTER — Other Ambulatory Visit: Payer: Self-pay | Admitting: Osteopathic Medicine

## 2019-05-17 NOTE — Telephone Encounter (Signed)
Please call patient: I received a refill request for his rosuvastatin cholesterol medication.  I went ahead and sent a refill but he is still due for follow-up labs, orders are in place for him to come to the lab fasting at his convenience, please remind him to get this done sometime in the next couple of weeks.

## 2019-05-17 NOTE — Telephone Encounter (Signed)
Pt has been updated regarding med refill and provider's note. Agreeable with plan. No other inquiries during call.

## 2019-05-25 ENCOUNTER — Other Ambulatory Visit: Payer: Self-pay

## 2019-05-25 ENCOUNTER — Ambulatory Visit (HOSPITAL_BASED_OUTPATIENT_CLINIC_OR_DEPARTMENT_OTHER): Payer: BC Managed Care – PPO | Attending: Osteopathic Medicine | Admitting: Internal Medicine

## 2019-05-25 DIAGNOSIS — G4733 Obstructive sleep apnea (adult) (pediatric): Secondary | ICD-10-CM | POA: Diagnosis not present

## 2019-05-25 DIAGNOSIS — R5382 Chronic fatigue, unspecified: Secondary | ICD-10-CM | POA: Diagnosis not present

## 2019-05-25 DIAGNOSIS — I10 Essential (primary) hypertension: Secondary | ICD-10-CM | POA: Insufficient documentation

## 2019-05-25 DIAGNOSIS — Z9189 Other specified personal risk factors, not elsewhere classified: Secondary | ICD-10-CM

## 2019-05-25 DIAGNOSIS — R0683 Snoring: Secondary | ICD-10-CM

## 2019-05-25 DIAGNOSIS — Z8679 Personal history of other diseases of the circulatory system: Secondary | ICD-10-CM

## 2019-05-28 DIAGNOSIS — Z8679 Personal history of other diseases of the circulatory system: Secondary | ICD-10-CM

## 2019-05-28 DIAGNOSIS — R5382 Chronic fatigue, unspecified: Secondary | ICD-10-CM | POA: Diagnosis not present

## 2019-05-28 DIAGNOSIS — R0683 Snoring: Secondary | ICD-10-CM

## 2019-05-28 DIAGNOSIS — Z9189 Other specified personal risk factors, not elsewhere classified: Secondary | ICD-10-CM

## 2019-05-28 NOTE — Procedures (Signed)
   Patient Name: Shawn Richards, Shawn Richards Date: 05/25/2019 Gender: Male D.O.B: [birth year] Age (years): 43 Referring Provider: Emeterio Reeve Height (inches): 52 Interpreting Physician: Baird Lyons MD, ABSM Weight (lbs): 260 RPSGT: Jacolyn Reedy BMI: 32 MRN: [id number] Neck Size: 17.50  CLINICAL INFORMATION Sleep Study Type: HST Indication for sleep study: Fatigue, Hypertension, Snoring Epworth Sleepiness Score: 8  SLEEP STUDY TECHNIQUE A multi-channel overnight portable sleep study was performed. The channels recorded were: nasal airflow, thoracic respiratory movement, and oxygen saturation with a pulse oximetry. Snoring was also monitored.  MEDICATIONS Patient self administered medications include: N/A.  SLEEP ARCHITECTURE Patient was studied for 385.8 minutes. The sleep efficiency was 99.6 % and the patient was supine for 44.3%. The arousal index was 0.2 per hour.  RESPIRATORY PARAMETERS The overall AHI was 6.1 per hour, with a central apnea index of 0.0 per hour. The oxygen nadir was 89% during sleep.  CARDIAC DATA Mean heart rate during sleep was 52.8 bpm.  IMPRESSIONS - Mild obstructive sleep apnea occurred during this study (AHI = 6.1/h). - No significant central sleep apnea occurred during this study (CAI = 0.0/h). - Mild oxygen desaturation was noted during this study (Min O2 = 89%). Mean sat 94%. - Patient snored.  DIAGNOSIS - Obstructive Sleep Apnea (327.23 [G47.33 ICD-10])  RECOMMENDATIONS - Treatment for very mild OSA is directed at symptoms. Conservative measures may include observation, weight loss and sleep off back. - Other options,including CPAP or a fitted oral appliance, would be baased on clinical judgment. - Be careful with alcohol, sedatives and other CNS depressants that may worsen sleep apnea and disrupt normal sleep architecture. - Sleep hygiene should be reviewed to assess factors that may improve sleep quality. - Weight management and  regular exercise should be initiated or continued.  [Electronically signed] 05/28/2019 12:05 PM  Baird Lyons MD, [place], American Board of Sleep Medicine   NPI: [id number]                          [place], [facility]  ELECTRONICALLY SIGNED ON:  05/28/2019, 11:53 AM [place] PH: [phone]   FX: [phone] [place]

## 2019-05-30 ENCOUNTER — Encounter: Payer: Self-pay | Admitting: Osteopathic Medicine

## 2019-05-30 ENCOUNTER — Other Ambulatory Visit: Payer: Self-pay | Admitting: Osteopathic Medicine

## 2019-05-30 DIAGNOSIS — G4733 Obstructive sleep apnea (adult) (pediatric): Secondary | ICD-10-CM | POA: Insufficient documentation

## 2019-05-30 HISTORY — DX: Obstructive sleep apnea (adult) (pediatric): G47.33

## 2019-05-30 MED ORDER — AMBULATORY NON FORMULARY MEDICATION: Status: DC

## 2019-09-21 IMAGING — US US ABDOMEN COMPLETE W/ ELASTOGRAPHY
1 series · 13 of 25 positions shown · non-contrast
Comparison: None.

CLINICAL DATA: Elevated liver function tests.



[Series 1: us abdomen complete w/ elastography · 0.22mm/px · 13 of 100 slices shown]
[im 1/100]
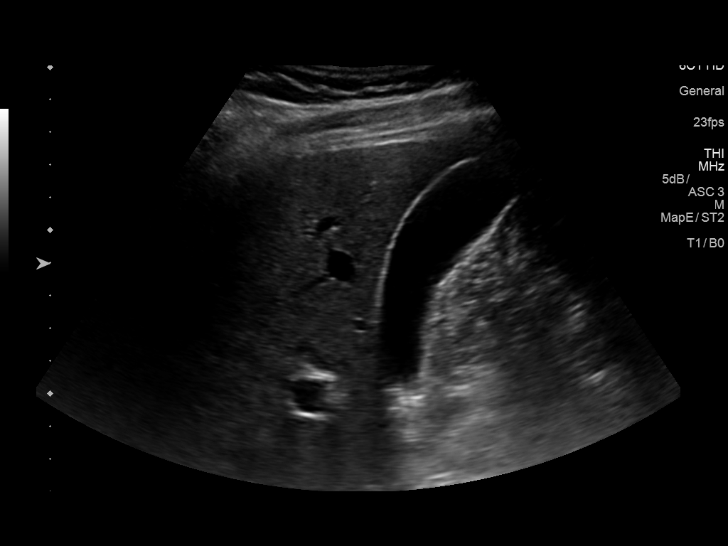
[im 9/100]
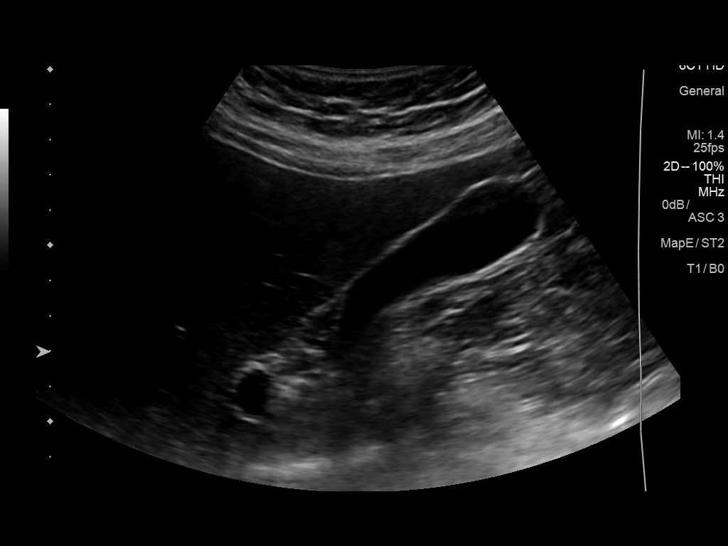
[im 17/100]
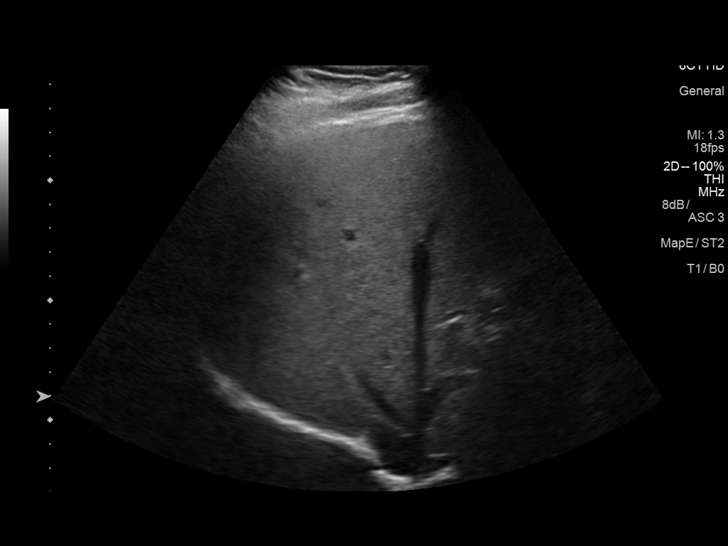
[im 25/100]
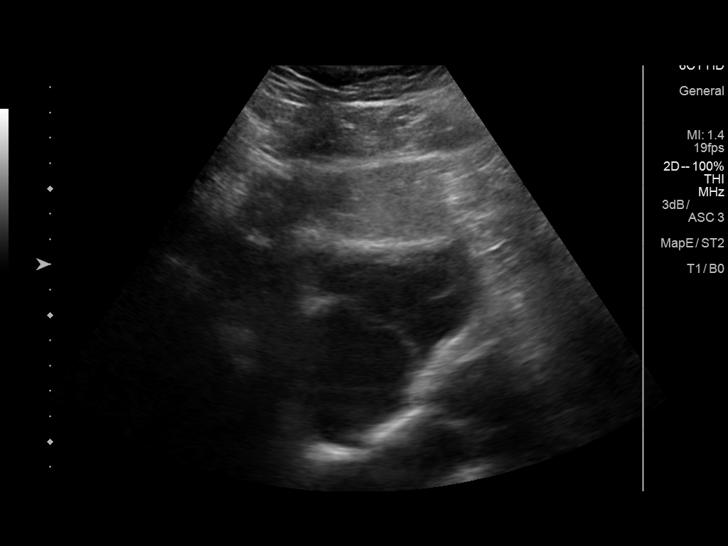
[im 34/100]
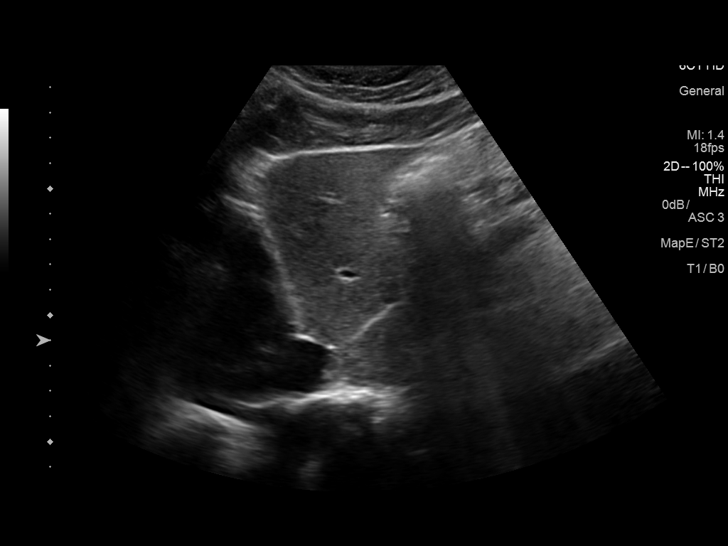
[im 42/100]
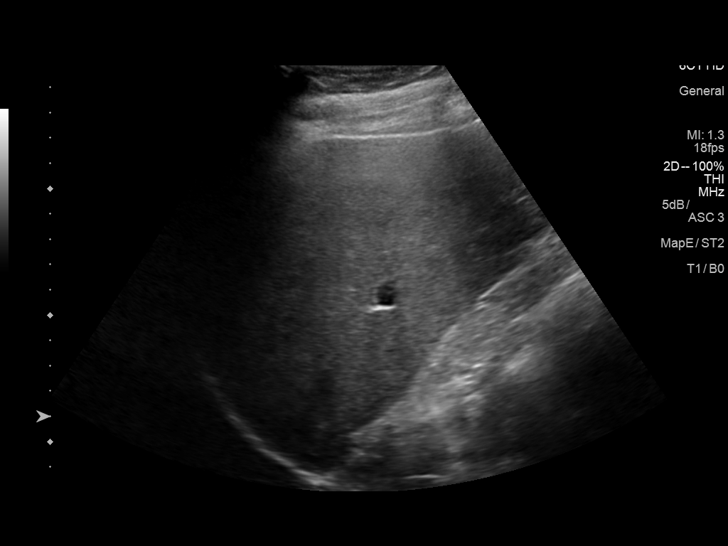
[im 50/100]
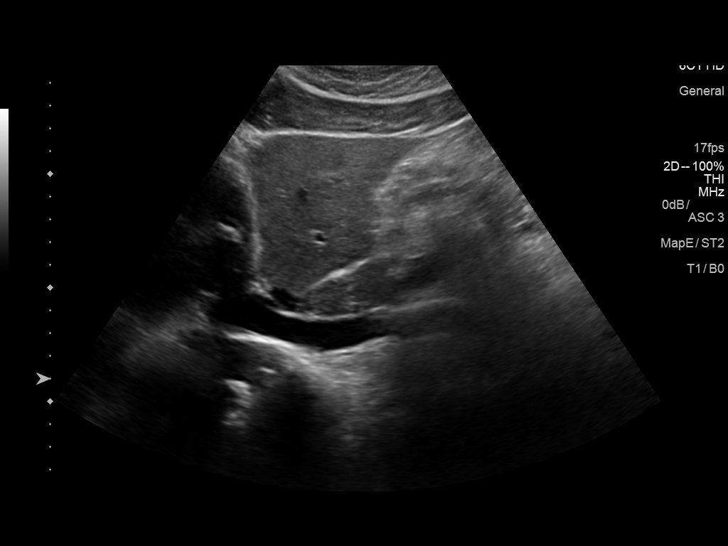
[im 58/100]
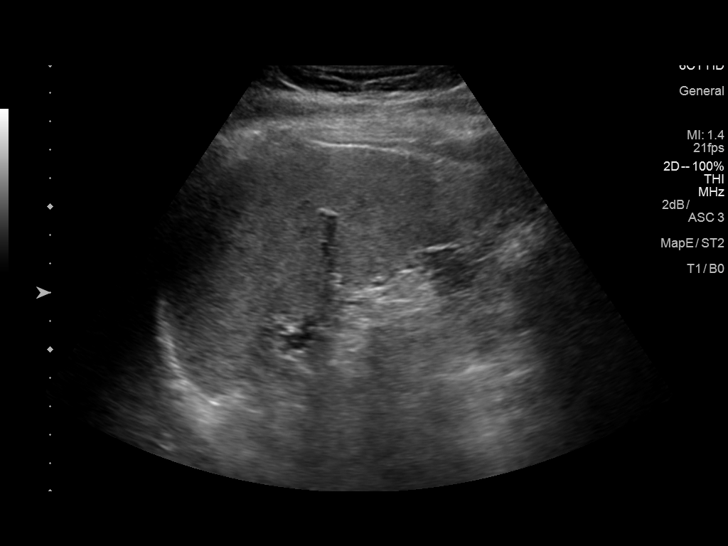
[im 67/100]
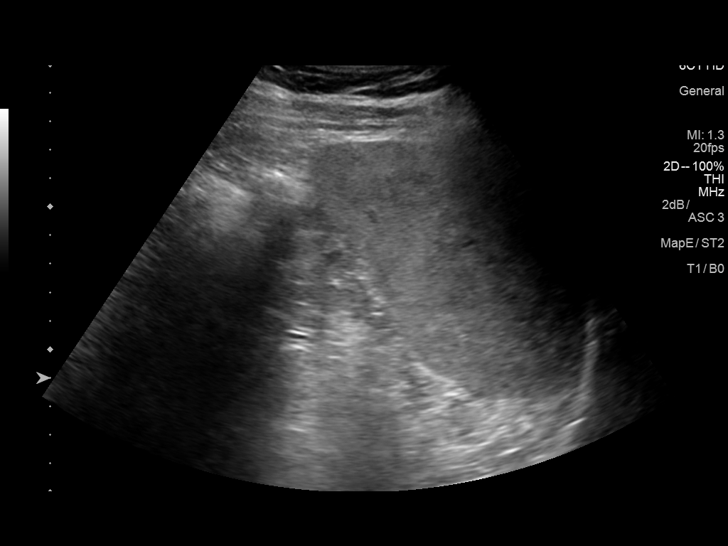
[im 75/100]
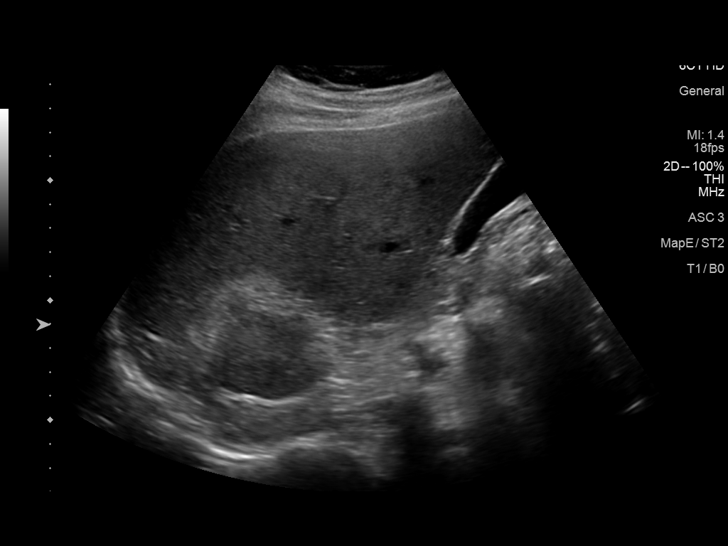
[im 83/100]
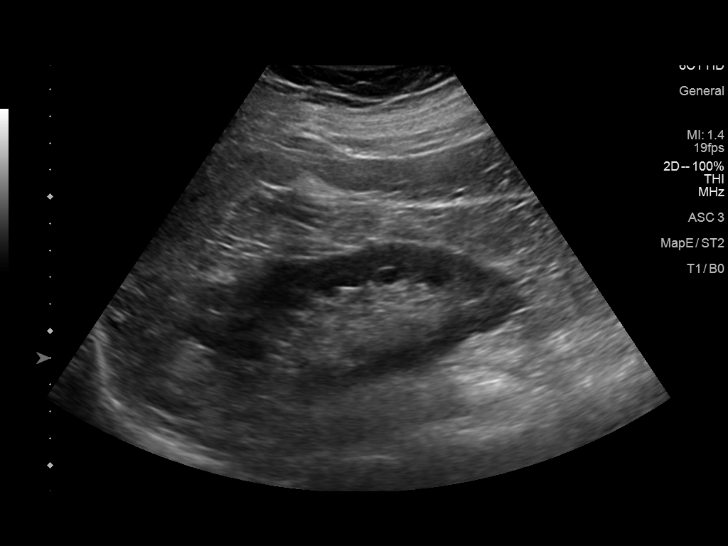
[im 91/100]
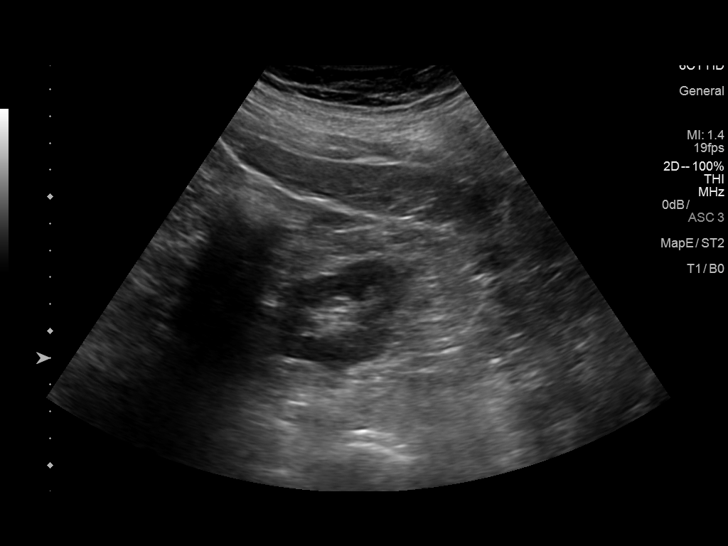
[im 100/100]
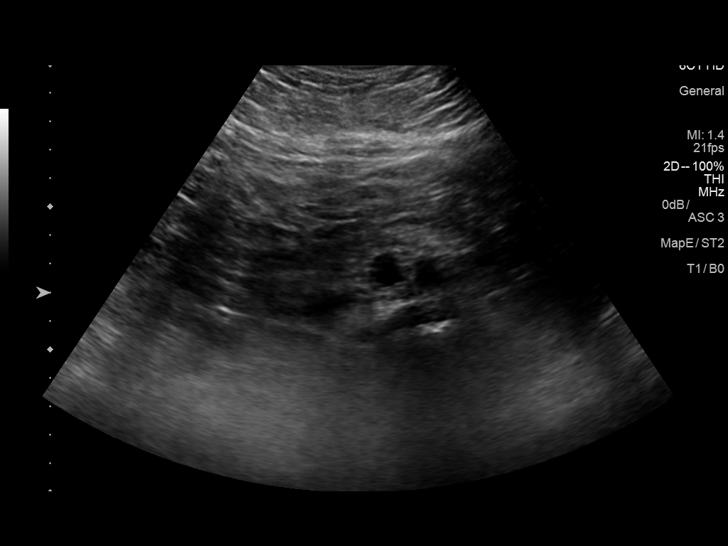

[13 of 25 positions shown; findings below may reference images not displayed]

FINDINGS: ULTRASOUND ABDOMEN

Gallbladder: No gallstones or wall thickening visualized. No
sonographic Murphy sign noted by sonographer.

Common bile duct: Diameter: 2 mm, within normal limits.

Liver: No focal lesion identified. Within normal limits in
parenchymal echogenicity. Portal vein is patent on color Doppler
imaging with normal direction of blood flow towards the liver.

IVC: No abnormality visualized.

Pancreas: Visualized portion unremarkable.

Spleen: Size and appearance within normal limits.

Right Kidney: Length: 13.0 cm. Echogenicity within normal limits. No
mass or hydronephrosis visualized.

Left Kidney: Length: 12.3 cm. Echogenicity within normal limits. No
mass or hydronephrosis visualized.

Abdominal aorta: No aneurysm visualized.

Other findings: None.

ULTRASOUND HEPATIC ELASTOGRAPHY

Device: Siemens Helix VTQ

Patient position: Left Lateral Decubitus

Transducer 6C1

Number of measurements: 10

Hepatic segment:  8

Median velocity:   0.89 m/sec

IQR:

IQR/Median velocity ratio:

Corresponding Metavir fibrosis score:  F0/F1

Risk of fibrosis: Minimal

Limitations of exam: None

Please note that abnormal shear wave velocities may also be
identified in clinical settings other than with hepatic fibrosis,
such as: acute hepatitis, elevated right heart and central venous
pressures including use of beta blockers, Hung-Yi disease
(Axberg), infiltrative processes such as
mastocytosis/amyloidosis/infiltrative tumor, extrahepatic
cholestasis, in the post-prandial state, and liver transplantation.
Correlation with patient history, laboratory data, and clinical
condition recommended.
IMPRESSION: ULTRASOUND ABDOMEN:

Normal study.  No hepatobiliary abnormality identified.

ULTRASOUND HEPATIC ELASTOGRAPHY:

Median hepatic shear wave velocity is calculated at 0.89 m/sec.

Corresponding Metavir fibrosis score is F0/F1.

Risk of fibrosis is Minimal.

Follow-up: None required

## 2019-11-22 LAB — HEPATIC FUNCTION PANEL
AG Ratio: 2.4 (calc) (ref 1.0–2.5)
ALT: 61 U/L — ABNORMAL HIGH (ref 9–46)
AST: 31 U/L (ref 10–40)
Albumin: 4.7 g/dL (ref 3.6–5.1)
Alkaline phosphatase (APISO): 45 U/L (ref 36–130)
Bilirubin, Direct: 0.3 mg/dL — ABNORMAL HIGH (ref 0.0–0.2)
Globulin: 2 g/dL (calc) (ref 1.9–3.7)
Indirect Bilirubin: 1.5 mg/dL (calc) — ABNORMAL HIGH (ref 0.2–1.2)
Total Bilirubin: 1.8 mg/dL — ABNORMAL HIGH (ref 0.2–1.2)
Total Protein: 6.7 g/dL (ref 6.1–8.1)

## 2019-11-22 LAB — LIPID PANEL
Cholesterol: 181 mg/dL (ref <200)
HDL: 49 mg/dL (ref >=40)
LDL Cholesterol (Calc): 107 mg/dL (calc) — ABNORMAL HIGH
Non-HDL Cholesterol (Calc): 132 mg/dL (calc) — ABNORMAL HIGH (ref <130)
Total CHOL/HDL Ratio: 3.7 (calc) (ref <5.0)
Triglycerides: 141 mg/dL (ref <150)

## 2019-11-22 LAB — PSA, TOTAL WITH REFLEX TO PSA, FREE: PSA, Total: 1.1 ng/mL (ref <=4.0)

## 2020-05-05 DIAGNOSIS — S91312A Laceration without foreign body, left foot, initial encounter: Secondary | ICD-10-CM | POA: Diagnosis not present

## 2020-05-05 DIAGNOSIS — Z23 Encounter for immunization: Secondary | ICD-10-CM | POA: Diagnosis not present

## 2020-05-05 DIAGNOSIS — T63514A Toxic effect of contact with stingray, undetermined, initial encounter: Secondary | ICD-10-CM | POA: Diagnosis not present

## 2020-05-05 DIAGNOSIS — S90851A Superficial foreign body, right foot, initial encounter: Secondary | ICD-10-CM | POA: Diagnosis not present

## 2020-05-07 ENCOUNTER — Ambulatory Visit (INDEPENDENT_AMBULATORY_CARE_PROVIDER_SITE_OTHER): Payer: BC Managed Care – PPO

## 2020-05-07 ENCOUNTER — Ambulatory Visit (INDEPENDENT_AMBULATORY_CARE_PROVIDER_SITE_OTHER): Payer: BC Managed Care – PPO | Admitting: Sports Medicine

## 2020-05-07 ENCOUNTER — Other Ambulatory Visit: Payer: Self-pay

## 2020-05-07 ENCOUNTER — Encounter: Payer: Self-pay | Admitting: Sports Medicine

## 2020-05-07 DIAGNOSIS — S8992XA Unspecified injury of left lower leg, initial encounter: Secondary | ICD-10-CM | POA: Diagnosis not present

## 2020-05-07 DIAGNOSIS — T63511A Toxic effect of contact with stingray, accidental (unintentional), initial encounter: Secondary | ICD-10-CM

## 2020-05-07 DIAGNOSIS — R2242 Localized swelling, mass and lump, left lower limb: Secondary | ICD-10-CM

## 2020-05-07 DIAGNOSIS — S90851A Superficial foreign body, right foot, initial encounter: Secondary | ICD-10-CM | POA: Diagnosis not present

## 2020-05-07 DIAGNOSIS — M7989 Other specified soft tissue disorders: Secondary | ICD-10-CM | POA: Diagnosis not present

## 2020-05-07 DIAGNOSIS — M79672 Pain in left foot: Secondary | ICD-10-CM | POA: Diagnosis not present

## 2020-05-07 DIAGNOSIS — R600 Localized edema: Secondary | ICD-10-CM | POA: Diagnosis not present

## 2020-05-07 DIAGNOSIS — S90852A Superficial foreign body, left foot, initial encounter: Secondary | ICD-10-CM

## 2020-05-07 DIAGNOSIS — S91312A Laceration without foreign body, left foot, initial encounter: Secondary | ICD-10-CM | POA: Insufficient documentation

## 2020-05-07 MED ORDER — PREDNISONE 50 MG PO TABS: ORAL_TABLET | ORAL | 0 refills | Status: DC

## 2020-05-07 MED ORDER — HYDROCODONE-ACETAMINOPHEN 5-325 MG PO TABS: 1.0000 | ORAL_TABLET | Freq: Three times a day (TID) | ORAL | 0 refills | Status: DC | PRN

## 2020-05-07 NOTE — Assessment & Plan Note (Addendum)
Pleasant 44 year old male, potentially stung with a stingray, potential retained foreign body based on x-rays obtained at an outside urgent care. His foot is very swollen, hot, minimally tender. There is a laceration over the dorsum of the midfoot with sutures in place. Continue antibiotics. Adding 5 days of steroids, hydrocodone, compression, icing, elevation. I would like a CT scan of the foot for confirmation as the x-rays are not quite convincing of a stingray barb. Return to see me next week, we will certainly want things calmed down before even consider wound exploration for foreign body.  Update: CT reviewed, definite findings consistent with cellulitis, no foreign body or stinger/barb seen.  Luckily we should just be able to treat this medically.

## 2020-05-07 NOTE — Progress Notes (Addendum)
    Procedures performed today:    None.  Independent interpretation of notes and tests performed by another provider:     Brief History, Exam, Impression, and Recommendations:    Foreign body in foot, left Pleasant 44 year old male, potentially stung with a stingray, potential retained foreign body based on x-rays obtained at an outside urgent care. His foot is very swollen, hot, minimally tender. There is a laceration over the dorsum of the midfoot with sutures in place. Continue antibiotics. Adding 5 days of steroids, hydrocodone, compression, icing, elevation. I would like a CT scan of the foot for confirmation as the x-rays are not quite convincing of a stingray barb. Return to see me next week, we will certainly want things calmed down before even consider wound exploration for foreign body.  Update: CT reviewed, definite findings consistent with cellulitis, no foreign body or stinger/barb seen.  Luckily we should just be able to treat this medically.    ___________________________________________ Shawn Richards. Benjamin Stain, M.D., ABFM., CAQSM. Primary Care and Sports Medicine [place] [facility]  Adjunct Instructor of Family Medicine  [employer]

## 2020-05-08 ENCOUNTER — Ambulatory Visit: Payer: BC Managed Care – PPO | Admitting: Osteopathic Medicine

## 2020-05-14 ENCOUNTER — Ambulatory Visit (INDEPENDENT_AMBULATORY_CARE_PROVIDER_SITE_OTHER): Payer: BC Managed Care – PPO | Admitting: Sports Medicine

## 2020-05-14 ENCOUNTER — Encounter: Payer: Self-pay | Admitting: Sports Medicine

## 2020-05-14 DIAGNOSIS — S91312D Laceration without foreign body, left foot, subsequent encounter: Secondary | ICD-10-CM | POA: Diagnosis not present

## 2020-05-14 MED ORDER — DOXYCYCLINE HYCLATE 100 MG PO TABS: 100.0000 mg | ORAL_TABLET | Freq: Two times a day (BID) | ORAL | 0 refills | Status: AC

## 2020-05-14 NOTE — Assessment & Plan Note (Signed)
This is a very pleasant 44 year old male, he was recently stung by a stingray, ultimately we obtained a CT scan that did not show any retained parts, he did have a significant laceration over the dorsum of his foot. I removed his sutures today and we applied Dermabond to reinforce the incision. He understands if this dehiscence he will need to come back immediately, there was a bit of erythema, far improved from the initial visit, but we are going to extend his course of antibiotics for 7 more days. Return to see me in 2 weeks either in person or virtually for a final wound check.

## 2020-05-14 NOTE — Progress Notes (Signed)
    Procedures performed today:    None.  Independent interpretation of notes and tests performed by another provider:   None.  Brief History, Exam, Impression, and Recommendations:    Laceration of foot, left This is a very pleasant 44 year old male, he was recently stung by a stingray, ultimately we obtained a CT scan that did not show any retained parts, he did have a significant laceration over the dorsum of his foot. I removed his sutures today and we applied Dermabond to reinforce the incision. He understands if this dehiscence he will need to come back immediately, there was a bit of erythema, far improved from the initial visit, but we are going to extend his course of antibiotics for 7 more days. Return to see me in 2 weeks either in person or virtually for a final wound check.    ___________________________________________ Ihor Austin. Benjamin Stain, M.D., ABFM., CAQSM. Primary Care and Sports Medicine [place] [facility]  Adjunct Instructor of Family Medicine  [employer]

## 2020-05-21 ENCOUNTER — Other Ambulatory Visit: Payer: Self-pay | Admitting: Osteopathic Medicine

## 2020-05-23 DIAGNOSIS — E785 Hyperlipidemia, unspecified: Secondary | ICD-10-CM | POA: Diagnosis not present

## 2020-05-23 DIAGNOSIS — S0592XA Unspecified injury of left eye and orbit, initial encounter: Secondary | ICD-10-CM | POA: Diagnosis not present

## 2020-05-23 DIAGNOSIS — H5712 Ocular pain, left eye: Secondary | ICD-10-CM | POA: Diagnosis not present

## 2020-05-23 DIAGNOSIS — H579 Unspecified disorder of eye and adnexa: Secondary | ICD-10-CM | POA: Diagnosis not present

## 2020-05-23 DIAGNOSIS — H5789 Other specified disorders of eye and adnexa: Secondary | ICD-10-CM | POA: Diagnosis not present

## 2020-05-23 DIAGNOSIS — I10 Essential (primary) hypertension: Secondary | ICD-10-CM | POA: Diagnosis not present

## 2020-05-28 ENCOUNTER — Telehealth (INDEPENDENT_AMBULATORY_CARE_PROVIDER_SITE_OTHER): Payer: BC Managed Care – PPO | Admitting: Sports Medicine

## 2020-05-28 DIAGNOSIS — S91312D Laceration without foreign body, left foot, subsequent encounter: Secondary | ICD-10-CM | POA: Diagnosis not present

## 2020-05-28 NOTE — Assessment & Plan Note (Addendum)
This is a very pleasant 44 year old male, he was stung by a stingray, there was a concern for retained stingray barb, x-rays and a CT scan did not show such, he did have a significant laceration to the dorsum of the foot, at the last visit we removed the sutures, I added another 7 days of antibiotics, today he is doing extremely well, the wound is healed (from what I can see on the camera), there is only a bit of swelling, no pain, full motion. I think can return to see me on an as-needed basis however I would like him to continue to wear some compressive stockings during the day.

## 2020-05-28 NOTE — Progress Notes (Signed)
   Virtual Visit via WebEx/MyChart   I connected with  ELCHANAN BOB  on 05/28/20 via WebEx/MyChart/Doximity Video and verified that I am speaking with the correct person using two identifiers.   I discussed the limitations, risks, security and privacy concerns of performing an evaluation and management service by WebEx/MyChart/Doximity Video, including the higher likelihood of inaccurate diagnosis and treatment, and the availability of in person appointments.  We also discussed the likely need of an additional face to face encounter for complete and high quality delivery of care.  I also discussed with the patient that there may be a patient responsible charge related to this service. The patient expressed understanding and wishes to proceed.  Provider location is in medical facility. Patient location is at their home, different from provider location. People involved in care of the patient during this telehealth encounter were myself, my nurse/medical assistant, and my front office/scheduling team member.  Review of Systems: No fevers, chills, night sweats, weight loss, chest pain, or shortness of breath.   Objective Findings:    General: Speaking full sentences, no audible heavy breathing.  Sounds alert and appropriately interactive.  Appears well.  Face symmetric.  Extraocular movements intact.  Pupils equal and round.  No nasal flaring or accessory muscle use visualized.  Independent interpretation of tests performed by another provider:   None.  Brief History, Exam, Impression, and Recommendations:    Laceration of foot, left This is a very pleasant 44 year old male, he was stung by a stingray, there was a concern for retained stingray barb, x-rays and a CT scan did not show such, he did have a significant laceration to the dorsum of the foot, at the last visit we removed the sutures, I added another 7 days of antibiotics, today he is doing extremely well, the wound is healed (from  what I can see on the camera), there is only a bit of swelling, no pain, full motion. I think can return to see me on an as-needed basis however I would like him to continue to wear some compressive stockings during the day.   I discussed the above assessment and treatment plan with the patient. The patient was provided an opportunity to ask questions and all were answered. The patient agreed with the plan and demonstrated an understanding of the instructions.   The patient was advised to call back or seek an in-person evaluation if the symptoms worsen or if the condition fails to improve as anticipated.   I provided 15 minutes of face to face and non-face-to-face time during this encounter date, time was needed to gather information, review chart, records, communicate/coordinate with staff remotely, as well as complete documentation.   ___________________________________________ Ihor Austin. Benjamin Stain, M.D., ABFM., CAQSM. Primary Care and Sports Medicine [facility]  Adjunct Instructor of Family Medicine  [employer]

## 2020-11-14 ENCOUNTER — Other Ambulatory Visit: Payer: Self-pay | Admitting: Osteopathic Medicine

## 2021-01-15 ENCOUNTER — Encounter: Payer: Self-pay | Admitting: Osteopathic Medicine

## 2021-01-15 ENCOUNTER — Telehealth: Payer: Self-pay

## 2021-01-15 DIAGNOSIS — E782 Mixed hyperlipidemia: Secondary | ICD-10-CM

## 2021-01-15 DIAGNOSIS — R748 Abnormal levels of other serum enzymes: Secondary | ICD-10-CM

## 2021-01-15 DIAGNOSIS — Z0000 Encounter for general adult medical examination without abnormal findings: Secondary | ICD-10-CM

## 2021-01-15 DIAGNOSIS — Z8679 Personal history of other diseases of the circulatory system: Secondary | ICD-10-CM

## 2021-01-15 NOTE — Telephone Encounter (Signed)
Pt left a vm msg requesting an order for annual labs. Per pt, he has an upcoming appt with provider. Annual labs ordered. Does provider need to add any additional testing?

## 2021-01-15 NOTE — Telephone Encounter (Signed)
Orders in, see mycahrt message to pt

## 2021-01-16 LAB — COMPLETE METABOLIC PANEL WITH GFR
AG Ratio: 2.4 (calc) (ref 1.0–2.5)
ALT: 43 U/L (ref 9–46)
AST: 25 U/L (ref 10–40)
Albumin: 4.7 g/dL (ref 3.6–5.1)
Alkaline phosphatase (APISO): 48 U/L (ref 36–130)
BUN: 22 mg/dL (ref 7–25)
CO2: 27 mmol/L (ref 20–32)
Calcium: 10.3 mg/dL (ref 8.6–10.3)
Chloride: 106 mmol/L (ref 98–110)
Creat: 1.22 mg/dL (ref 0.60–1.29)
Globulin: 2 g/dL (calc) (ref 1.9–3.7)
Glucose, Bld: 89 mg/dL (ref 65–99)
Potassium: 4.5 mmol/L (ref 3.5–5.3)
Sodium: 141 mmol/L (ref 135–146)
Total Bilirubin: 1.7 mg/dL — ABNORMAL HIGH (ref 0.2–1.2)
Total Protein: 6.7 g/dL (ref 6.1–8.1)
eGFR: 74 mL/min/{1.73_m2} (ref >=60)

## 2021-01-16 LAB — LIPID PANEL W/REFLEX DIRECT LDL
Cholesterol: 164 mg/dL (ref <200)
HDL: 46 mg/dL (ref >=40)
LDL Cholesterol (Calc): 101 mg/dL (calc) — ABNORMAL HIGH
Non-HDL Cholesterol (Calc): 118 mg/dL (calc) (ref <130)
Total CHOL/HDL Ratio: 3.6 (calc) (ref <5.0)
Triglycerides: 82 mg/dL (ref <150)

## 2021-01-16 LAB — CBC
HCT: 44.9 % (ref 38.5–50.0)
Hemoglobin: 15.2 g/dL (ref 13.2–17.1)
MCH: 29.8 pg (ref 27.0–33.0)
MCHC: 33.9 g/dL (ref 32.0–36.0)
MCV: 88 fL (ref 80.0–100.0)
MPV: 10.2 fL (ref 7.5–12.5)
Platelets: 216 10*3/uL (ref 140–400)
RBC: 5.1 10*6/uL (ref 4.20–5.80)
RDW: 12.3 % (ref 11.0–15.0)
WBC: 4.7 10*3/uL (ref 3.8–10.8)

## 2021-01-23 ENCOUNTER — Encounter: Payer: Self-pay | Admitting: Osteopathic Medicine

## 2021-01-23 ENCOUNTER — Ambulatory Visit (INDEPENDENT_AMBULATORY_CARE_PROVIDER_SITE_OTHER): Payer: BC Managed Care – PPO | Admitting: Osteopathic Medicine

## 2021-01-23 ENCOUNTER — Other Ambulatory Visit: Payer: Self-pay

## 2021-01-23 VITALS — BP 135/75 | HR 57 | Temp 97.8°F | Wt 242.0 lb

## 2021-01-23 DIAGNOSIS — E782 Mixed hyperlipidemia: Secondary | ICD-10-CM

## 2021-01-23 DIAGNOSIS — Z1211 Encounter for screening for malignant neoplasm of colon: Secondary | ICD-10-CM

## 2021-01-23 DIAGNOSIS — Z8042 Family history of malignant neoplasm of prostate: Secondary | ICD-10-CM

## 2021-01-23 DIAGNOSIS — H9193 Unspecified hearing loss, bilateral: Secondary | ICD-10-CM

## 2021-01-23 DIAGNOSIS — R3912 Poor urinary stream: Secondary | ICD-10-CM

## 2021-01-23 DIAGNOSIS — Z0000 Encounter for general adult medical examination without abnormal findings: Secondary | ICD-10-CM | POA: Diagnosis not present

## 2021-01-23 MED ORDER — ROSUVASTATIN CALCIUM 20 MG PO TABS: ORAL_TABLET | ORAL | 3 refills | Status: DC

## 2021-01-23 MED ORDER — TAMSULOSIN HCL 0.4 MG PO CAPS: 0.4000 mg | ORAL_CAPSULE | Freq: Every day | ORAL | 3 refills | Status: DC

## 2021-01-23 NOTE — Patient Instructions (Signed)
General Preventive Care Most recent routine screening labs: see attached.  Blood pressure goal 130/80 or less.  Tobacco: don't!  Alcohol: responsible moderation is ok for most adults - if you have concerns about your alcohol intake, please talk to me!  Exercise: as tolerated to reduce risk of cardiovascular disease and diabetes. Strength training will also prevent osteoporosis.  Mental health: if need for mental health care (medicines, counseling, other), or concerns about moods, please let me know!  Sexual / Reproductive health: if need for STD testing, or if concerns with libido/pain problems, please let me know! If you need to discuss family planning, please let me know!  Advanced Directive: Living Will and/or Healthcare Power of Attorney recommended for all adults, regardless of age or health.  Vaccines Flu vaccine: recommended every fall.  Shingles vaccine: after age 37.  Pneumonia vaccines: after age 33 Tetanus booster: every 10 years  COVID vaccine: STRONGLY RECOMMENDED  Cancer screenings  Colon cancer screening: for everyone age 60-75. Colonoscopy available for all, many people also qualify for the Cologuard stool test  Prostate cancer screening: routine PSA blood test age 59-71.  Lung cancer screening: not needed for non-smokers  Infection screenings  HIV: recommended screening at least once age 8-65, more often as needed. Gonorrhea/Chlamydia: screening as needed, Hepatitis C: recommended once for everyone age 17-75 TB: certain at-risk populations, or depending on work requirements and/or travel history Other Bone Density Test: recommended for men at age 76 Abdominal Aortic Aneurysm: screening with ultrasound recommended once for men age 11-75 who have ever smoked 100+ cigarettes

## 2021-01-23 NOTE — Progress Notes (Signed)
Shawn Richards is a 45 y.o. male who presents to  [facility]  today, 01/23/21, seeking care for the following:  Annual physical Concern for hearing loss - bilateral, no tinnitus/headache/vertigo Concern for weak urinary stream more so over past year, nocturia sometimes up to 3 times per night.      ASSESSMENT & PLAN with other pertinent findings:  The primary encounter diagnosis was Annual physical exam. Diagnoses of Mixed hyperlipidemia, Family history of prostate cancer in father, Colon cancer screening, Weak urinary stream, and Bilateral hearing loss, unspecified hearing loss type were also pertinent to this visit.    Patient Instructions  General Preventive Care Most recent routine screening labs: see attached.  Blood pressure goal 130/80 or less.  Tobacco: don't!  Alcohol: responsible moderation is ok for most adults - if you have concerns about your alcohol intake, please talk to me!  Exercise: as tolerated to reduce risk of cardiovascular disease and diabetes. Strength training will also prevent osteoporosis.  Mental health: if need for mental health care (medicines, counseling, other), or concerns about moods, please let me know!  Sexual / Reproductive health: if need for STD testing, or if concerns with libido/pain problems, please let me know! If you need to discuss family planning, please let me know!  Advanced Directive: Living Will and/or Healthcare Power of Attorney recommended for all adults, regardless of age or health.  Vaccines Flu vaccine: recommended every fall.  Shingles vaccine: after age 81.  Pneumonia vaccines: after age 96 Tetanus booster: every 10 years  COVID vaccine: STRONGLY RECOMMENDED  Cancer screenings  Colon cancer screening: for everyone age 60-75. Colonoscopy available for all, many people also qualify for the Cologuard stool test  Prostate cancer screening: routine PSA blood test age  10-71.  Lung cancer screening: not needed for non-smokers  Infection screenings  HIV: recommended screening at least once age 66-65, more often as needed. Gonorrhea/Chlamydia: screening as needed, Hepatitis C: recommended once for everyone age 15-52 TB: certain at-risk populations, or depending on work requirements and/or travel history Other Bone Density Test: recommended for men at age 39 Abdominal Aortic Aneurysm: screening with ultrasound recommended once for men age 46-75 who have ever smoked 100+ cigarettes   Orders Placed This Encounter  Procedures   Ambulatory referral to Gastroenterology   Ambulatory referral to Audiology     Meds ordered this encounter  Medications   rosuvastatin (CRESTOR) 20 MG tablet    Sig: TAKE ONE TABLET BY MOUTH ONE TIME DAILY    Dispense:  90 tablet    Refill:  3   tamsulosin (FLOMAX) 0.4 MG CAPS capsule    Sig: Take 1 capsule (0.4 mg total) by mouth daily.    Dispense:  90 capsule    Refill:  3     See below for relevant physical exam findings  See below for recent lab and imaging results reviewed  Medications, allergies, PMH, PSH, SocH, FamH reviewed below    Follow-up instructions: Return in about 1 year (around 01/23/2022) for CALL us TO SCHEDULE ANNUAL CHECK-UP ([place] REMINDER SET) - SEE [place] SOONER IF NEEDED.                                        Exam:  BP 135/75 (BP Location: Left Arm, Patient Position: Sitting, Cuff Size: Normal)   Pulse (!) 57  Temp 97.8 F (36.6 C) (Oral)   Wt 242 lb 0.6 oz (109.8 kg)   BMI 30.25 kg/m  Constitutional: VS see above. General Appearance: alert, well-developed, well-nourished, NAD Neck: No masses, trachea midline.  Respiratory: Normal respiratory effort. no wheeze, no rhonchi, no rales Cardiovascular: S1/S2 normal, no murmur, no rub/gallop auscultated. RRR.  Musculoskeletal: Gait normal. Symmetric and independent movement of all extremities Abdominal:  non-tender, non-distended, no appreciable organomegaly, neg Murphy's, BS WNLx4 Neurological: Normal balance/coordination. No tremor. Skin: warm, dry, intact. Back examined - multiple benign-appearing nevi and skin tags Psychiatric: Normal judgment/insight. Normal mood and affect. Oriented x3.   Current Meds  Medication Sig   AMBULATORY NON FORMULARY MEDICATION Supply ordered: CPAP and other supplies needed (headgear, cushions, filters, heated tuubing and water chamber) Dx: obstructive sleep apnea Settings: auto-titration 5-20 cmH2O   rosuvastatin (CRESTOR) 20 MG tablet TAKE ONE TABLET BY MOUTH ONE TIME DAILY   tamsulosin (FLOMAX) 0.4 MG CAPS capsule Take 1 capsule (0.4 mg total) by mouth daily.    Allergies  Allergen Reactions   Lipitor [Atorvastatin]     LFTs   Zocor [Simvastatin]     LFTs    Patient Active Problem List   Diagnosis Date Noted   Laceration of foot, left 05/07/2020   Obstructive sleep apnea 05/30/2019   Family history of prostate cancer in father 12/22/2018   Elevated liver enzymes 12/22/2018   Lateral epicondylitis, right elbow 03/13/2016   Nephrolithiasis 03/01/2013   Erectile dysfunction 04/17/2011   ELEVATED BLOOD PRESSURE WITHOUT DIAGNOSIS OF HYPERTENSION 10/30/2008   Hyperlipidemia 06/02/2007    Family History  Problem Relation Age of Onset   Cancer Father        prostate   Hyperlipidemia Father    Dementia Father    Hyperlipidemia Mother     Social History   Tobacco Use  Smoking Status Never  Smokeless Tobacco Never    Past Surgical History:  Procedure Laterality Date   sinus polyps  1995   TYMPANOSTOMY TUBE PLACEMENT      Immunization History  Administered Date(s) Administered   Tdap 05/05/2020    Recent Results (from the past 2160 hour(s))  Lipid Panel w/reflex Direct LDL     Status: Abnormal   Collection Time: 01/16/21  8:00 AM  Result Value Ref Range   Cholesterol 164 <200 mg/dL   HDL 46 > OR = 40 mg/dL   Triglycerides 82  <150 mg/dL   LDL Cholesterol (Calc) 101 (H) mg/dL (calc)    Comment: Reference range: <100 . Desirable range <100 mg/dL for primary prevention;   <70 mg/dL for patients with CHD or diabetic patients  with > or = 2 CHD risk factors. Marland Kitchen LDL-C is now calculated using the Martin-Hopkins  calculation, which is a validated novel method providing  better accuracy than the Friedewald equation in the  estimation of LDL-C.  Cresenciano Genre et al. Annamaria Helling. 6045;409(81): 2061-2068  (http://education.[employer].com/faq/FAQ164)    Total CHOL/HDL Ratio 3.6 <5.0 (calc)   Non-HDL Cholesterol (Calc) 118 <130 mg/dL (calc)    Comment: For patients with diabetes plus 1 major ASCVD risk  factor, treating to a non-HDL-C goal of <100 mg/dL  (LDL-C of <70 mg/dL) is considered a therapeutic  option.   CBC     Status: None   Collection Time: 01/16/21  8:00 AM  Result Value Ref Range   WBC 4.7 3.8 - 10.8 Thousand/uL   RBC 5.10 4.20 - 5.80 Million/uL   Hemoglobin 15.2 13.2 - 17.1 g/dL  HCT 44.9 38.5 - 50.0 %   MCV 88.0 80.0 - 100.0 fL   MCH 29.8 27.0 - 33.0 pg   MCHC 33.9 32.0 - 36.0 g/dL   RDW 12.3 11.0 - 15.0 %   Platelets 216 140 - 400 Thousand/uL   MPV 10.2 7.5 - 12.5 fL  COMPLETE METABOLIC PANEL WITH GFR     Status: Abnormal   Collection Time: 01/16/21  8:00 AM  Result Value Ref Range   Glucose, Bld 89 65 - 99 mg/dL    Comment: .            Fasting reference interval .    BUN 22 7 - 25 mg/dL   Creat 1.22 0.60 - 1.29 mg/dL   eGFR 74 > OR = 60 mL/min/1.73m    Comment: The eGFR is based on the CKD-EPI 2021 equation. To calculate  the new eGFR from a previous Creatinine or Cystatin C result, go to https://www.kidney.org/professionals/ kdoqi/gfr%5Fcalculator    BUN/Creatinine Ratio NOT APPLICABLE 6 - 22 (calc)   Sodium 141 135 - 146 mmol/L   Potassium 4.5 3.5 - 5.3 mmol/L   Chloride 106 98 - 110 mmol/L   CO2 27 20 - 32 mmol/L   Calcium 10.3 8.6 - 10.3 mg/dL   Total Protein 6.7 6.1 - 8.1  g/dL   Albumin 4.7 3.6 - 5.1 g/dL   Globulin 2.0 1.9 - 3.7 g/dL (calc)   AG Ratio 2.4 1.0 - 2.5 (calc)   Total Bilirubin 1.7 (H) 0.2 - 1.2 mg/dL   Alkaline phosphatase (APISO) 48 36 - 130 U/L   AST 25 10 - 40 U/L   ALT 43 9 - 46 U/L    No results found.     All questions at time of visit were answered - patient instructed to contact office with any additional concerns or updates. ER/RTC precautions were reviewed with the patient as applicable.   Please note: manual typing as well as voice recognition software may have been used to produce this document - typos may escape review. Please contact Dr. ASheppard Coilfor any needed clarifications.

## 2021-02-05 ENCOUNTER — Ambulatory Visit: Payer: BC Managed Care – PPO | Attending: Osteopathic Medicine | Admitting: Audiologist

## 2021-02-05 ENCOUNTER — Other Ambulatory Visit: Payer: Self-pay

## 2021-02-05 DIAGNOSIS — H9193 Unspecified hearing loss, bilateral: Secondary | ICD-10-CM | POA: Diagnosis not present

## 2021-02-05 NOTE — Procedures (Signed)
  Outpatient Audiology and [facility] [address] [place], [place]  [address] [phone]  AUDIOLOGICAL  EVALUATION  NAME: Shawn Richards     DOB:   [birth year]      MRN: [id number]                                                                                     DATE: 02/05/2021     REFERENT: Sunnie Nielsen, DO STATUS: Outpatient DIAGNOSIS: Decreased Hearing    History: Shawn Richards was seen for an audiological evaluation.  Shawn Richards is receiving a hearing evaluation due to concerns for difficulty hearing in noise . Shawn Richards has difficulty hearing in background noise, crowds, and when people are not facing him. This difficulty began gradually. His brother who is close in age is also having difficulty hearing. His father also has hearing loss. No pain or pressure reported in either ear. No tinnitus present in either ear. Shawn Richards has a history of noise exposure from recreational firearm use. He had tubes as a child after experiencing chronic ear infections.  Medical history negative for a condition which is a risk factor for hearing loss. No other relevant case history reported.   Evaluation:  Otoscopy showed a clear view of the tympanic membranes, bilaterally Tympanometry results were consistent with normal middle ear function bilaterally  with slight hypercompliance in left ear Audiometric testing was completed using conventional audiometry with insert transducer. Speech Recognition Thresholds were consistent with pure tone averages. Word Recognition was excellent at conversation level. Pure tone thresholds show normal hearing in both ears. Test results are consistent with slight noise notch of decreased hearing at 4k Hz, worse in the left ear but still in normal hearing range.  QuickSIN, a speech in noise test, was presented bilaterally. Normal ability to hear in the presence of noise with a 1.5dB SNR.    Results:  The test results were reviewed with [name]. He has  essentially normal hearing and good ability to hear in background noise. He does have a slight decrease in his hearing at the frequencies where we see noise damage appear. The hearing is slightly worse in the left ear which is typical for right handed shooters. This frequency is where high frequency consonants appear, such as /sh/ /s/ and /ch/. If someone is not facing him or is more than five feet away he not be able to hear these consonants. Recommend only speaking face to face within five feet for clear communication. Do not try to communicate from separate rooms or without seeing the other person's face.   Recommendations: Recommend monitoring hearing with hearing evaluations annually.  Wear hearing protection around noise louder than 85dB. Make sure hearing protection has an adequate NRR rating, for firearms recommend over the ear and plugs with NRR of at least 20dB.     Ammie Ferrier  Audiologist, Au.D., CCC-A 02/05/2021  8:28 AM  Cc: Sunnie Nielsen, DO

## 2021-04-01 ENCOUNTER — Other Ambulatory Visit: Payer: Self-pay

## 2021-04-01 ENCOUNTER — Ambulatory Visit (AMBULATORY_SURGERY_CENTER): Payer: BC Managed Care – PPO | Admitting: *Deleted

## 2021-04-01 VITALS — Ht 76.0 in | Wt 242.0 lb

## 2021-04-01 DIAGNOSIS — Z1211 Encounter for screening for malignant neoplasm of colon: Secondary | ICD-10-CM

## 2021-04-01 MED ORDER — PEG 3350-KCL-NA BICARB-NACL 420 G PO SOLR: 4000.0000 mL | Freq: Once | ORAL | 0 refills | Status: AC

## 2021-04-01 NOTE — Progress Notes (Signed)

## 2021-04-04 ENCOUNTER — Encounter: Payer: Self-pay | Admitting: Gastroenterology

## 2021-04-05 ENCOUNTER — Encounter: Payer: BC Managed Care – PPO | Admitting: Gastroenterology

## 2021-04-17 ENCOUNTER — Encounter: Payer: Self-pay | Admitting: Gastroenterology

## 2021-04-17 ENCOUNTER — Ambulatory Visit (AMBULATORY_SURGERY_CENTER): Payer: BC Managed Care – PPO | Admitting: Gastroenterology

## 2021-04-17 VITALS — BP 174/91 | HR 52 | Temp 98.4°F | Resp 15 | Ht 76.0 in | Wt 242.0 lb

## 2021-04-17 DIAGNOSIS — Z1211 Encounter for screening for malignant neoplasm of colon: Secondary | ICD-10-CM | POA: Diagnosis not present

## 2021-04-17 DIAGNOSIS — K5730 Diverticulosis of large intestine without perforation or abscess without bleeding: Secondary | ICD-10-CM

## 2021-04-17 MED ORDER — SODIUM CHLORIDE 0.9 % IV SOLN
500.0000 mL | Freq: Once | INTRAVENOUS | Status: DC
Start: 2021-04-17 — End: 2021-04-17

## 2021-04-17 NOTE — Progress Notes (Signed)
GASTROENTEROLOGY PROCEDURE H&P NOTE   Primary Care Physician: Sunnie Nielsen, DO    Reason for Procedure:  Colon Cancer screening  Plan:    Colonoscopy  Patient is appropriate for endoscopic procedure(s) in the ambulatory ([facility]) setting.  The nature of the procedure, as well as the risks, benefits, and alternatives were carefully and thoroughly reviewed with the patient. Ample time for discussion and questions allowed. The patient understood, was satisfied, and agreed to proceed.     HPI: Shawn Richards is a 45 y.o. male who presents for colonoscopy for routine Colon Cancer screening.  No active GI symptoms.  No known family history of colon cancer or related malignancy.  Patient is otherwise without complaints or active issues today.  Past Medical History:  Diagnosis Date   Fatty liver    Hyperlipidemia    Hypertension 2006   treated for only 6 months   Hypotestosteronism    Obstructive sleep apnea 05/30/2019   Sleep study 05/2019    Past Surgical History:  Procedure Laterality Date   sinus polyps  1995   TYMPANOSTOMY TUBE PLACEMENT      Prior to Admission medications   Medication Sig Start Date End Date Taking? Authorizing Provider  rosuvastatin (CRESTOR) 10 MG tablet Take by mouth.   Yes [provider]    Current Outpatient Medications  Medication Sig Dispense Refill   rosuvastatin (CRESTOR) 10 MG tablet Take by mouth.     Current Facility-Administered Medications  Medication Dose Route Frequency Provider Last Rate Last Admin   0.9 %  sodium chloride infusion  500 mL Intravenous Once Talyah Seder V, DO        Allergies as of 04/17/2021 - Review Complete 04/17/2021  Allergen Reaction Noted   Lipitor [atorvastatin]  08/31/2014   Zocor [simvastatin]  08/31/2014    Family History  Problem Relation Age of Onset   Hyperlipidemia Mother    Prostate cancer Father    Cancer Father        prostate   Hyperlipidemia Father    Dementia  Father    Colon cancer Neg Hx    Colon polyps Neg Hx    Esophageal cancer Neg Hx    Rectal cancer Neg Hx    Stomach cancer Neg Hx     Social History   Socioeconomic History   Marital status: Married    Spouse name: Not on file   Number of children: Not on file   Years of education: Not on file   Highest education level: Not on file  Occupational History   Not on file  Tobacco Use   Smoking status: Never   Smokeless tobacco: Never  Vaping Use   Vaping Use: Never used  Substance and Sexual Activity   Alcohol use: Yes    Alcohol/week: 2.0 standard drinks    Types: 2 Standard drinks or equivalent per week   Drug use: Not Currently   Sexual activity: Yes    Birth control/protection: Surgical  Other Topics Concern   Not on file  Social History Narrative   Not on file   Social Determinants of Health   Financial Resource Strain: Not on file  Food Insecurity: Not on file  Transportation Needs: Not on file  Physical Activity: Not on file  Stress: Not on file  Social Connections: Not on file  Intimate Partner Violence: Not on file    Physical Exam: Vital signs in last 24 hours: @BP  (!) 147/81   Pulse (!) 57  Temp 98.4 F (36.9 C)   Ht 6\' 4"  (1.93 m)   Wt 242 lb (109.8 kg)   SpO2 98%   BMI 29.46 kg/m  GEN: NAD EYE: Sclerae anicteric ENT: MMM CV: Non-tachycardic Pulm: CTA b/l GI: Soft, NT/ND NEURO:  Alert & Oriented x 3   , DO [facility]   04/17/2021 9:51 AM

## 2021-04-17 NOTE — Progress Notes (Signed)
A and O x3. Report to RN. Tolerated MAC anesthesia well. 

## 2021-04-17 NOTE — Op Note (Signed)
[facility] Patient Name: Shawn Richards Procedure Date: 04/17/2021 9:51 AM MRN: [id number] Endoscopist: Doristine Locks , MD Age: 45 Referring MD:  Date of Birth: 03/13/1976 Gender: Male Account #: [id number] Procedure:                Colonoscopy Indications:              Screening for colorectal malignant neoplasm, This                            is the patient's first colonoscopy Medicines:                Monitored Anesthesia Care Procedure:                Pre-Anesthesia Assessment:                           - Prior to the procedure, a History and Physical                            was performed, and patient medications and                            allergies were reviewed. The patient's tolerance of                            previous anesthesia was also reviewed. The risks                            and benefits of the procedure and the sedation                            options and risks were discussed with the patient.                            All questions were answered, and informed consent                            was obtained. Prior Anticoagulants: The patient has                            taken no previous anticoagulant or antiplatelet                            agents. ASA Grade Assessment: II - A patient with                            mild systemic disease. After reviewing the risks                            and benefits, the patient was deemed in                            satisfactory condition to undergo the procedure.  After obtaining informed consent, the colonoscope                            was passed under direct vision. Throughout the                            procedure, the patient's blood pressure, pulse, and                            oxygen saturations were monitored continuously. The                            CF [id number] #[id number] was introduced through the anus                            and advanced to the  the terminal ileum. The                            colonoscopy was performed without difficulty. The                            patient tolerated the procedure well. The quality                            of the bowel preparation was good. The terminal                            ileum, ileocecal valve, appendiceal orifice, and                            rectum were photographed. Scope In: 9:57:51 AM Scope Out: 10:16:02 AM Scope Withdrawal Time: 0 hours 14 minutes 22 seconds  Total Procedure Duration: 0 hours 18 minutes 11 seconds  Findings:                 The perianal and digital rectal examinations were                            normal.                           Multiple small and large-mouthed diverticula were                            found in the sigmoid colon.                           The exam was otherwise normal throughout the                            remainder of the colon.                           The retroflexed view of the distal rectum and anal  verge was normal and showed no anal or rectal                            abnormalities.                           The terminal ileum appeared normal. Complications:            No immediate complications. Estimated Blood Loss:     Estimated blood loss: none. Impression:               - Diverticulosis in the sigmoid colon.                           - The distal rectum and anal verge are normal on                            retroflexion view.                           - The examined portion of the ileum was normal.                           - No specimens collected. Recommendation:           - Patient has a contact number available for                            emergencies. The signs and symptoms of potential                            delayed complications were discussed with the                            patient. Return to normal activities tomorrow.                            Written discharge  instructions were provided to the                            patient.                           - Resume previous diet.                           - Continue present medications.                           - Repeat colonoscopy in 10 years for screening                            purposes.                           - Use fiber, for example Citrucel, Fibercon, [employer]  or Metamucil.                           - Return to GI clinic PRN. Doristine Locks, MD 04/17/2021 10:23:19 AM

## 2021-04-17 NOTE — Patient Instructions (Signed)
YOU HAD AN ENDOSCOPIC PROCEDURE TODAY AT THE [facility]:   Refer to the procedure report that was given to you for any specific questions about what was found during the examination.  If the procedure report does not answer your questions, please call your gastroenterologist to clarify.  If you requested that your care partner not be given the details of your procedure findings, then the procedure report has been included in a sealed envelope for you to review at your convenience later.  YOU SHOULD EXPECT: Some feelings of bloating in the abdomen. Passage of more gas than usual.  Walking can help get rid of the air that was put into your GI tract during the procedure and reduce the bloating. If you had a lower endoscopy (such as a colonoscopy or flexible sigmoidoscopy) you may notice spotting of blood in your stool or on the toilet paper. If you underwent a bowel prep for your procedure, you may not have a normal bowel movement for a few days.  Please Note:  You might notice some irritation and congestion in your nose or some drainage.  This is from the oxygen used during your procedure.  There is no need for concern and it should clear up in a day or so.  SYMPTOMS TO REPORT IMMEDIATELY:  Following lower endoscopy (colonoscopy or flexible sigmoidoscopy):  Excessive amounts of blood in the stool  Significant tenderness or worsening of abdominal pains  Swelling of the abdomen that is new, acute  Fever of 100F or higher    For urgent or emergent issues, a gastroenterologist can be reached at any hour by calling [phone]. Do not use MyChart messaging for urgent concerns.    DIET:  We do recommend a small meal at first, but then you may proceed to your regular diet.  Drink plenty of fluids but you should avoid alcoholic beverages for 24 hours.  ACTIVITY:  You should plan to take it easy for the rest of today and you should NOT DRIVE or use heavy machinery until tomorrow (because  of the sedation medicines used during the test).    FOLLOW UP: Our staff will call the number listed on your records 48-72 hours following your procedure to check on you and address any questions or concerns that you may have regarding the information given to you following your procedure. If we do not reach you, we will leave a message.  We will attempt to reach you two times.  During this call, we will ask if you have developed any symptoms of COVID 19. If you develop any symptoms (ie: fever, flu-like symptoms, shortness of breath, cough etc.) before then, please call [phone].  If you test positive for Covid 19 in the 2 weeks post procedure, please call and report this information to [place].    If any biopsies were taken you will be contacted by phone or by letter within the next 1-3 weeks.  Please call us at [phone] if you have not heard about the biopsies in 3 weeks.    SIGNATURES/CONFIDENTIALITY: You and/or your care partner have signed paperwork which will be entered into your electronic medical record.  These signatures attest to the fact that that the information above on your After Visit Summary has been reviewed and is understood.  Full responsibility of the confidentiality of this discharge information lies with you and/or your care-partner.    Resume medications. Refer to report for fiber recommendations. Information given on diverticulosis.

## 2021-04-17 NOTE — Progress Notes (Signed)
VS taken by [place] 

## 2021-04-17 NOTE — Progress Notes (Signed)
Pt's states no medical or surgical changes since previsit or office visit. 

## 2021-04-19 ENCOUNTER — Telehealth: Payer: Self-pay

## 2021-04-19 ENCOUNTER — Telehealth: Payer: Self-pay | Admitting: *Deleted

## 2021-04-19 NOTE — Telephone Encounter (Signed)
  Follow up Call-  Call back number 04/17/2021  Post procedure Call Back phone  # [phone]  Permission to leave phone message Yes  Some recent data might be hidden     Patient questions:  Do you have a fever, pain , or abdominal swelling? No. Pain Score  0 *  Have you tolerated food without any problems? Yes.    Have you been able to return to your normal activities? Yes.    Do you have any questions about your discharge instructions: Diet   No. Medications  No. Follow up visit  No.  Do you have questions or concerns about your Care? No.  Actions: * If pain score is 4 or above: No action needed, pain <4.  Have you developed a fever since your procedure? no  2.   Have you had an respiratory symptoms (SOB or cough) since your procedure? no  3.   Have you tested positive for COVID 19 since your procedure no  4.   Have you had any family members/close contacts diagnosed with the COVID 19 since your procedure?  no   If yes to any of these questions please route to Laverna Peace, RN and Karlton Lemon, RN

## 2021-04-19 NOTE — Telephone Encounter (Signed)
First follow up call attempt.  LVM. 

## 2021-05-30 DIAGNOSIS — G459 Transient cerebral ischemic attack, unspecified: Secondary | ICD-10-CM | POA: Diagnosis not present

## 2021-05-30 DIAGNOSIS — K2289 Other specified disease of esophagus: Secondary | ICD-10-CM | POA: Diagnosis not present

## 2021-05-30 DIAGNOSIS — R260 Ataxic gait: Secondary | ICD-10-CM | POA: Diagnosis not present

## 2021-05-30 DIAGNOSIS — R1310 Dysphagia, unspecified: Secondary | ICD-10-CM | POA: Diagnosis not present

## 2021-05-30 DIAGNOSIS — R066 Hiccough: Secondary | ICD-10-CM | POA: Diagnosis not present

## 2021-05-30 DIAGNOSIS — R471 Dysarthria and anarthria: Secondary | ICD-10-CM | POA: Diagnosis not present

## 2021-05-30 DIAGNOSIS — I63211 Cerebral infarction due to unspecified occlusion or stenosis of right vertebral arteries: Secondary | ICD-10-CM | POA: Insufficient documentation

## 2021-05-30 DIAGNOSIS — R2681 Unsteadiness on feet: Secondary | ICD-10-CM | POA: Diagnosis not present

## 2021-05-30 DIAGNOSIS — H02401 Unspecified ptosis of right eyelid: Secondary | ICD-10-CM | POA: Diagnosis not present

## 2021-05-30 DIAGNOSIS — R29810 Facial weakness: Secondary | ICD-10-CM | POA: Diagnosis not present

## 2021-05-30 DIAGNOSIS — I160 Hypertensive urgency: Secondary | ICD-10-CM | POA: Diagnosis not present

## 2021-05-30 DIAGNOSIS — K760 Fatty (change of) liver, not elsewhere classified: Secondary | ICD-10-CM | POA: Diagnosis not present

## 2021-05-30 DIAGNOSIS — R29818 Other symptoms and signs involving the nervous system: Secondary | ICD-10-CM | POA: Diagnosis not present

## 2021-05-30 DIAGNOSIS — R202 Paresthesia of skin: Secondary | ICD-10-CM | POA: Diagnosis not present

## 2021-05-30 DIAGNOSIS — I6503 Occlusion and stenosis of bilateral vertebral arteries: Secondary | ICD-10-CM | POA: Diagnosis not present

## 2021-05-30 DIAGNOSIS — I639 Cerebral infarction, unspecified: Secondary | ICD-10-CM | POA: Diagnosis not present

## 2021-05-30 DIAGNOSIS — I6501 Occlusion and stenosis of right vertebral artery: Secondary | ICD-10-CM | POA: Insufficient documentation

## 2021-05-30 DIAGNOSIS — R531 Weakness: Secondary | ICD-10-CM | POA: Diagnosis not present

## 2021-05-30 DIAGNOSIS — K449 Diaphragmatic hernia without obstruction or gangrene: Secondary | ICD-10-CM | POA: Diagnosis not present

## 2021-05-30 DIAGNOSIS — H5702 Anisocoria: Secondary | ICD-10-CM | POA: Diagnosis not present

## 2021-05-30 DIAGNOSIS — G450 Vertebro-basilar artery syndrome: Secondary | ICD-10-CM | POA: Diagnosis not present

## 2021-05-30 DIAGNOSIS — R001 Bradycardia, unspecified: Secondary | ICD-10-CM | POA: Diagnosis not present

## 2021-05-30 DIAGNOSIS — I10 Essential (primary) hypertension: Secondary | ICD-10-CM | POA: Diagnosis not present

## 2021-05-30 DIAGNOSIS — I63341 Cerebral infarction due to thrombosis of right cerebellar artery: Secondary | ICD-10-CM | POA: Diagnosis not present

## 2021-05-30 DIAGNOSIS — I7090 Unspecified atherosclerosis: Secondary | ICD-10-CM | POA: Diagnosis not present

## 2021-05-30 DIAGNOSIS — R42 Dizziness and giddiness: Secondary | ICD-10-CM | POA: Diagnosis not present

## 2021-05-30 DIAGNOSIS — K222 Esophageal obstruction: Secondary | ICD-10-CM | POA: Diagnosis not present

## 2021-05-30 DIAGNOSIS — G463 Brain stem stroke syndrome: Secondary | ICD-10-CM | POA: Diagnosis not present

## 2021-05-30 DIAGNOSIS — E7800 Pure hypercholesterolemia, unspecified: Secondary | ICD-10-CM | POA: Diagnosis not present

## 2021-05-30 DIAGNOSIS — R9082 White matter disease, unspecified: Secondary | ICD-10-CM | POA: Diagnosis not present

## 2021-05-30 DIAGNOSIS — I69391 Dysphagia following cerebral infarction: Secondary | ICD-10-CM | POA: Diagnosis not present

## 2021-06-05 DIAGNOSIS — R1310 Dysphagia, unspecified: Secondary | ICD-10-CM | POA: Diagnosis not present

## 2021-06-05 DIAGNOSIS — R1311 Dysphagia, oral phase: Secondary | ICD-10-CM | POA: Diagnosis not present

## 2021-06-05 DIAGNOSIS — R2689 Other abnormalities of gait and mobility: Secondary | ICD-10-CM | POA: Diagnosis not present

## 2021-06-05 DIAGNOSIS — Z7409 Other reduced mobility: Secondary | ICD-10-CM | POA: Diagnosis not present

## 2021-06-05 DIAGNOSIS — E7800 Pure hypercholesterolemia, unspecified: Secondary | ICD-10-CM | POA: Diagnosis not present

## 2021-06-05 DIAGNOSIS — I63211 Cerebral infarction due to unspecified occlusion or stenosis of right vertebral arteries: Secondary | ICD-10-CM | POA: Diagnosis not present

## 2021-06-05 DIAGNOSIS — I160 Hypertensive urgency: Secondary | ICD-10-CM | POA: Diagnosis not present

## 2021-06-05 DIAGNOSIS — I69392 Facial weakness following cerebral infarction: Secondary | ICD-10-CM | POA: Diagnosis not present

## 2021-06-05 DIAGNOSIS — I69398 Other sequelae of cerebral infarction: Secondary | ICD-10-CM | POA: Diagnosis not present

## 2021-06-05 DIAGNOSIS — H53459 Other localized visual field defect, unspecified eye: Secondary | ICD-10-CM | POA: Diagnosis not present

## 2021-06-05 DIAGNOSIS — K5900 Constipation, unspecified: Secondary | ICD-10-CM | POA: Diagnosis not present

## 2021-06-05 DIAGNOSIS — I69390 Apraxia following cerebral infarction: Secondary | ICD-10-CM | POA: Diagnosis not present

## 2021-06-05 DIAGNOSIS — R209 Unspecified disturbances of skin sensation: Secondary | ICD-10-CM | POA: Diagnosis not present

## 2021-06-05 DIAGNOSIS — K219 Gastro-esophageal reflux disease without esophagitis: Secondary | ICD-10-CM | POA: Diagnosis not present

## 2021-06-05 DIAGNOSIS — G464 Cerebellar stroke syndrome: Secondary | ICD-10-CM | POA: Diagnosis not present

## 2021-06-05 DIAGNOSIS — I10 Essential (primary) hypertension: Secondary | ICD-10-CM | POA: Diagnosis not present

## 2021-06-05 DIAGNOSIS — Z8673 Personal history of transient ischemic attack (TIA), and cerebral infarction without residual deficits: Secondary | ICD-10-CM | POA: Diagnosis not present

## 2021-06-05 DIAGNOSIS — R066 Hiccough: Secondary | ICD-10-CM | POA: Diagnosis not present

## 2021-06-12 DIAGNOSIS — R2689 Other abnormalities of gait and mobility: Secondary | ICD-10-CM | POA: Diagnosis not present

## 2021-06-12 DIAGNOSIS — I69951 Hemiplegia and hemiparesis following unspecified cerebrovascular disease affecting right dominant side: Secondary | ICD-10-CM | POA: Diagnosis not present

## 2021-06-12 DIAGNOSIS — I69998 Other sequelae following unspecified cerebrovascular disease: Secondary | ICD-10-CM | POA: Diagnosis not present

## 2021-06-18 ENCOUNTER — Encounter: Payer: Self-pay | Admitting: Family Medicine

## 2021-06-18 ENCOUNTER — Other Ambulatory Visit: Payer: Self-pay

## 2021-06-18 ENCOUNTER — Ambulatory Visit (INDEPENDENT_AMBULATORY_CARE_PROVIDER_SITE_OTHER): Payer: BC Managed Care – PPO | Admitting: Family Medicine

## 2021-06-18 VITALS — BP 129/83 | HR 55 | Temp 98.8°F | Ht 76.0 in | Wt 236.0 lb

## 2021-06-18 DIAGNOSIS — E785 Hyperlipidemia, unspecified: Secondary | ICD-10-CM | POA: Diagnosis not present

## 2021-06-18 DIAGNOSIS — I10 Essential (primary) hypertension: Secondary | ICD-10-CM

## 2021-06-18 DIAGNOSIS — Z09 Encounter for follow-up examination after completed treatment for conditions other than malignant neoplasm: Secondary | ICD-10-CM | POA: Diagnosis not present

## 2021-06-18 NOTE — Progress Notes (Signed)
Acute Office Visit  Subjective:    Patient ID: Shawn Richards, male    DOB: [birth year], 45 y.o.   MRN: [id number]  Chief Complaint  Patient presents with   [facility]     HPI Patient is in today for hospital follow-up.  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  Patient reports he is feeling mostly back to baseline now.  States he goes to PT and OT and his only deficit has been being able to balance on 1 foot with his eyes closed.  Reports that he occasionally has some temperature sensation differences between his left and right extremities saying that sometimes it is harder to tell how hot something is with his left hand.  States this seems to be improving slowly. Otherwise he is doing really well.  States he is feeling good and would like to go back to work.  Reports that when he was in the hospital he stayed for 4 days inpatient rehab and was able to leave given his rapid improvement.  He denies any dizziness, vision changes, chest pain, shortness of breath, weakness or asymmetry, headaches.   He is scheduled for neuro follow-up in about 1 month.    Past Medical History:  Diagnosis Date   Fatty liver    Hyperlipidemia    Hypertension 2006   treated for only 6 months   Hypotestosteronism    Obstructive sleep apnea 05/30/2019   Sleep study 05/2019    Past Surgical History:  Procedure Laterality Date   sinus polyps  1995   TYMPANOSTOMY TUBE PLACEMENT      Family History  Problem Relation Age of Onset   Hyperlipidemia Mother    Prostate cancer Father    Cancer Father        prostate   Hyperlipidemia Father    Dementia Father    Colon cancer Neg Hx    Colon polyps Neg Hx    Esophageal cancer Neg Hx    Rectal cancer Neg Hx    Stomach cancer Neg Hx     Social History    Socioeconomic History   Marital status: Married    Spouse name: Not on file   Number of children: Not on file   Years of education: Not on file   Highest education level: Not on file  Occupational History   Not on file  Tobacco Use   Smoking status: Never   Smokeless tobacco: Never  Vaping Use   Vaping Use: Never used  Substance and Sexual Activity   Alcohol use: Yes    Alcohol/week: 2.0 standard drinks    Types: 2 Standard drinks or equivalent per week   Drug use: Not Currently   Sexual activity: Yes    Birth control/protection: Surgical  Other Topics Concern   Not on file  Social History Narrative   Not on file   Social Determinants of Health   Financial Resource Strain: Not on file  Food Insecurity: Not on file  Transportation Needs: Not on file  Physical Activity: Not on file  Stress: Not on file  Social Connections: Not on file  Intimate Partner Violence: Not on file    Outpatient Medications Prior to Visit  Medication Sig Dispense Refill   aspirin 81 MG chewable tablet Chew by mouth.  atorvastatin (LIPITOR) 40 MG tablet Take 40 mg by mouth at bedtime.     calcium carbonate (OS-CAL) 1250 (500 Ca) MG chewable tablet Chew by mouth.     chlorproMAZINE (THORAZINE) 10 MG tablet SMARTSIG:2.5 Tablet(s) By Mouth 3 Times Daily PRN     clopidogrel (PLAVIX) 75 MG tablet Take 75 mg by mouth daily.     rosuvastatin (CRESTOR) 10 MG tablet Take by mouth. (Patient not taking: Reported on 06/18/2021)     No facility-administered medications prior to visit.    Allergies  Allergen Reactions   Lipitor [Atorvastatin]     LFTs   Zocor [Simvastatin]     LFTs    Review of Systems All review of systems negative except what is listed in the HPI     Objective:    Physical Exam Vitals reviewed.  Constitutional:      Appearance: Normal appearance.  HENT:     Head: Normocephalic and atraumatic.  Eyes:     Extraocular Movements: Extraocular movements intact.      Conjunctiva/sclera: Conjunctivae normal.     Pupils: Pupils are equal, round, and reactive to light.  Cardiovascular:     Rate and Rhythm: Normal rate and regular rhythm.     Pulses: Normal pulses.     Heart sounds: Normal heart sounds.  Pulmonary:     Effort: Pulmonary effort is normal.     Breath sounds: Normal breath sounds.  Musculoskeletal:        General: Normal range of motion.     Cervical back: Normal range of motion and neck supple.  Skin:    General: Skin is warm and dry.     Capillary Refill: Capillary refill takes less than 2 seconds.  Neurological:     General: No focal deficit present.     Mental Status: He is alert and oriented to person, place, and time. Mental status is at baseline.     Cranial Nerves: No cranial nerve deficit.     Sensory: No sensory deficit.     Motor: No weakness.     Coordination: Coordination normal.     Gait: Gait normal.     Comments: NIHSS = 0  Psychiatric:        Mood and Affect: Mood normal.        Behavior: Behavior normal.        Thought Content: Thought content normal.        Judgment: Judgment normal.    BP 129/83 (BP Location: Left Arm, Patient Position: Sitting, Cuff Size: Large)    Pulse (!) 55    Temp 98.8 F (37.1 C) (Oral)    Ht 6' 4"  (1.93 m)    Wt 236 lb 0.6 oz (107.1 kg)    SpO2 100%    BMI 28.73 kg/m  Wt Readings from Last 3 Encounters:  06/18/21 236 lb 0.6 oz (107.1 kg)  04/17/21 242 lb (109.8 kg)  04/01/21 242 lb (109.8 kg)    Health Maintenance Due  Topic Date Due   HIV Screening  Never done   Hepatitis C Screening  Never done    There are no preventive care reminders to display for this patient.   Lab Results  Component Value Date   TSH 2.06 03/11/2017   Lab Results  Component Value Date   WBC 4.7 01/16/2021   HGB 15.2 01/16/2021   HCT 44.9 01/16/2021   MCV 88.0 01/16/2021   PLT 216 01/16/2021   Lab Results  Component Value Date  NA 141 01/16/2021   K 4.5 01/16/2021   CO2 27 01/16/2021    GLUCOSE 89 01/16/2021   BUN 22 01/16/2021   CREATININE 1.22 01/16/2021   BILITOT 1.7 (H) 01/16/2021   ALKPHOS 47 01/23/2016   AST 25 01/16/2021   ALT 43 01/16/2021   PROT 6.7 01/16/2021   ALBUMIN 4.7 01/23/2016   CALCIUM 10.3 01/16/2021   EGFR 74 01/16/2021   Lab Results  Component Value Date   CHOL 164 01/16/2021   Lab Results  Component Value Date   HDL 46 01/16/2021   Lab Results  Component Value Date   LDLCALC 101 (H) 01/16/2021   Lab Results  Component Value Date   TRIG 82 01/16/2021   Lab Results  Component Value Date   CHOLHDL 3.6 01/16/2021   Lab Results  Component Value Date   HGBA1C 5.5 01/30/2012       Assessment & Plan:    1. Hyperlipidemia, unspecified hyperlipidemia type 2. Hypertension, unspecified type 3. Hospital discharge follow-up Neuro exam is normal today. No restrictions currently. Recommend continuing gradual return to ADLs.  Rechecking lab work today - we will let you know results.  Keep all upcoming appointments.   - CBC with Differential/Platelet - Comprehensive metabolic panel - Lipid panel   Follow-up as scheduled or sooner if needed.   Purcell Nails Olevia Bowens, DNP, FNP-C

## 2021-06-18 NOTE — Patient Instructions (Signed)
Neuro exam is normal today.  Rechecking lab work today - we will let you know results.  Keep all upcoming appointments.

## 2021-06-19 LAB — CBC WITH DIFFERENTIAL/PLATELET
Absolute Monocytes: 678 cells/uL (ref 200–950)
Basophils Absolute: 51 cells/uL (ref 0–200)
Basophils Relative: 0.8 %
Eosinophils Absolute: 230 cells/uL (ref 15–500)
Eosinophils Relative: 3.6 %
HCT: 42.1 % (ref 38.5–50.0)
Hemoglobin: 14 g/dL (ref 13.2–17.1)
Lymphs Abs: 1638 cells/uL (ref 850–3900)
MCH: 29.7 pg (ref 27.0–33.0)
MCHC: 33.3 g/dL (ref 32.0–36.0)
MCV: 89.4 fL (ref 80.0–100.0)
MPV: 9.9 fL (ref 7.5–12.5)
Monocytes Relative: 10.6 %
Neutro Abs: 3802 cells/uL (ref 1500–7800)
Neutrophils Relative %: 59.4 %
Platelets: 283 10*3/uL (ref 140–400)
RBC: 4.71 10*6/uL (ref 4.20–5.80)
RDW: 12.7 % (ref 11.0–15.0)
Total Lymphocyte: 25.6 %
WBC: 6.4 10*3/uL (ref 3.8–10.8)

## 2021-06-19 LAB — COMPREHENSIVE METABOLIC PANEL
AG Ratio: 2.3 (calc) (ref 1.0–2.5)
ALT: 57 U/L — ABNORMAL HIGH (ref 9–46)
AST: 26 U/L (ref 10–40)
Albumin: 4.4 g/dL (ref 3.6–5.1)
Alkaline phosphatase (APISO): 47 U/L (ref 36–130)
BUN: 21 mg/dL (ref 7–25)
CO2: 29 mmol/L (ref 20–32)
Calcium: 9.9 mg/dL (ref 8.6–10.3)
Chloride: 106 mmol/L (ref 98–110)
Creat: 0.79 mg/dL (ref 0.60–1.29)
Globulin: 1.9 g/dL (calc) (ref 1.9–3.7)
Glucose, Bld: 99 mg/dL (ref 65–99)
Potassium: 4.8 mmol/L (ref 3.5–5.3)
Sodium: 142 mmol/L (ref 135–146)
Total Bilirubin: 0.9 mg/dL (ref 0.2–1.2)
Total Protein: 6.3 g/dL (ref 6.1–8.1)

## 2021-06-19 LAB — LIPID PANEL
Cholesterol: 190 mg/dL (ref <200)
HDL: 47 mg/dL (ref >=40)
LDL Cholesterol (Calc): 124 mg/dL (calc) — ABNORMAL HIGH
Non-HDL Cholesterol (Calc): 143 mg/dL (calc) — ABNORMAL HIGH (ref <130)
Total CHOL/HDL Ratio: 4 (calc) (ref <5.0)
Triglycerides: 87 mg/dL (ref <150)

## 2021-07-01 DIAGNOSIS — I119 Hypertensive heart disease without heart failure: Secondary | ICD-10-CM | POA: Diagnosis not present

## 2021-07-01 DIAGNOSIS — I69351 Hemiplegia and hemiparesis following cerebral infarction affecting right dominant side: Secondary | ICD-10-CM | POA: Diagnosis not present

## 2021-07-10 ENCOUNTER — Ambulatory Visit (INDEPENDENT_AMBULATORY_CARE_PROVIDER_SITE_OTHER): Payer: BC Managed Care – PPO | Admitting: Family Medicine

## 2021-07-10 ENCOUNTER — Other Ambulatory Visit: Payer: Self-pay

## 2021-07-10 ENCOUNTER — Encounter: Payer: Self-pay | Admitting: Family Medicine

## 2021-07-10 DIAGNOSIS — Z8673 Personal history of transient ischemic attack (TIA), and cerebral infarction without residual deficits: Secondary | ICD-10-CM | POA: Diagnosis not present

## 2021-07-10 DIAGNOSIS — I10 Essential (primary) hypertension: Secondary | ICD-10-CM

## 2021-07-10 MED ORDER — LISINOPRIL 5 MG PO TABS: 5.0000 mg | ORAL_TABLET | Freq: Every day | ORAL | 3 refills | Status: DC

## 2021-07-10 MED ORDER — ATORVASTATIN CALCIUM 40 MG PO TABS
40.0000 mg | ORAL_TABLET | Freq: Every day | ORAL | 3 refills | Status: DC
Start: 2021-07-10 — End: 2022-07-28

## 2021-07-10 NOTE — Assessment & Plan Note (Signed)
Blood pressure well controlled with current dose of lisinopril.  Medication renewed.

## 2021-07-10 NOTE — Progress Notes (Signed)
Shawn Richards - 46 y.o. male MRN [id number]  Date of birth: 1975/11/18  Subjective Chief Complaint  Patient presents with   Transitions Of Care    HPI Shawn Richards is a 46 year old male here today for follow-up visit.  He is transferring care from Dr. Lyn Richards.  He was in fairly good health until last month when he was admitted after presenting with TIA/strokelike symptoms of right facial and hand paresthesia and unsteady gait.  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  This is suspected to be related to chiropractic manipulation of the neck.  He does still have some mild numbness and tingling in his left arm but this is improving.  He continues on dual antiplatelet therapy with Plavix and aspirin.  He only has a couple of days of Plavix remaining.  He is taking atorvastatin and tolerating well.  He has follow-up with neurology in a few weeks.  He does need refills on atorvastatin as well as lisinopril today.  ROS:  A comprehensive ROS was completed and negative except as noted per HPI  No Active Allergies  Past Medical History:  Diagnosis Date   Fatty liver    Hyperlipidemia    Hypertension 2006   treated for only 6 months   Hypotestosteronism    Obstructive sleep apnea 05/30/2019   Sleep study 05/2019    Past Surgical History:  Procedure Laterality Date   sinus polyps  1995   TYMPANOSTOMY TUBE PLACEMENT      Social History   Socioeconomic History   Marital status: Married    Spouse name: Not on file   Number of children: Not on file   Years of education: Not on file   Highest education level: Not on file  Occupational History   Not on file  Tobacco Use   Smoking status: Never   Smokeless tobacco: Never  Vaping Use   Vaping Use: Never used  Substance and Sexual Activity   Alcohol use: Yes    Alcohol/week: 2.0 standard drinks    Types: 2 Standard drinks or equivalent per week   Drug use: Not Currently   Sexual activity:  Yes    Birth control/protection: Surgical  Other Topics Concern   Not on file  Social History Narrative   Not on file   Social Determinants of Health   Financial Resource Strain: Not on file  Food Insecurity: Not on file  Transportation Needs: Not on file  Physical Activity: Not on file  Stress: Not on file  Social Connections: Not on file    Family History  Problem Relation Age of Onset   Hyperlipidemia Mother    Prostate cancer Father    Cancer Father        prostate   Hyperlipidemia Father    Dementia Father    Colon cancer Neg Hx    Colon polyps Neg Hx    Esophageal cancer Neg Hx    Rectal cancer Neg Hx    Stomach cancer Neg Hx     Health Maintenance  Topic Date Due   COVID-19 Vaccine (1) Never done   HIV Screening  Never done   Hepatitis C Screening  Never done   INFLUENZA VACCINE  09/20/2021 (Originally 01/21/2021)   TETANUS/TDAP  05/05/2030   COLONOSCOPY (Pts 45-34yrs Insurance coverage will need to be confirmed)  04/18/2031   Pneumococcal Vaccine 4-43 Years old  Aged Out   HPV VACCINES  Aged Out     -----------------------------------------------------------------------------------------------------------------------------------------------------------------------------------------------------------------  Physical Exam BP (!) 143/79 (BP Location: Left Arm, Patient Position: Sitting, Cuff Size: Large)    Pulse 62    Temp 97.6 F (36.4 C)    Ht 6\' 4"  (1.93 m)    Wt 237 lb (107.5 kg)    SpO2 100%    BMI 28.85 kg/m   Physical Exam Constitutional:      Appearance: Normal appearance.  Eyes:     General: No scleral icterus. Cardiovascular:     Rate and Rhythm: Normal rate and regular rhythm.  Pulmonary:     Effort: Pulmonary effort is normal.     Breath sounds: Normal breath sounds.  Musculoskeletal:     Cervical back: Neck supple.  Neurological:     General: No focal deficit present.     Mental Status: He is alert.  Psychiatric:        Mood and  Affect: Mood normal.        Behavior: Behavior normal.    ------------------------------------------------------------------------------------------------------------------------------------------------------------------------------------------------------------------- Assessment and Plan  Essential hypertension Blood pressure well controlled with current dose of lisinopril.  Medication renewed.  History of CVA (cerebrovascular accident) Continues to have mild tingling down the left arm.  He will continue dual antiplatelet therapy for the next couple of days and then transition to aspirin only.  Continue statin.  He will follow-up with neurology in a few weeks.   Meds ordered this encounter  Medications   atorvastatin (LIPITOR) 40 MG tablet    Sig: Take 1 tablet (40 mg total) by mouth at bedtime.    Dispense:  90 tablet    Refill:  3   lisinopril (ZESTRIL) 5 MG tablet    Sig: Take 1 tablet (5 mg total) by mouth daily.    Dispense:  90 tablet    Refill:  3    Return in about 4 months (around 11/07/2021) for HTN/CVA.    This visit occurred during the SARS-CoV-2 public health emergency.  Safety protocols were in place, including screening questions prior to the visit, additional usage of staff PPE, and extensive cleaning of exam room while observing appropriate contact time as indicated for disinfecting solutions.

## 2021-07-10 NOTE — Assessment & Plan Note (Signed)
Continues to have mild tingling down the left arm.  He will continue dual antiplatelet therapy for the next couple of days and then transition to aspirin only.  Continue statin.  He will follow-up with neurology in a few weeks.

## 2021-07-24 DIAGNOSIS — R2689 Other abnormalities of gait and mobility: Secondary | ICD-10-CM | POA: Diagnosis not present

## 2021-07-24 DIAGNOSIS — I69951 Hemiplegia and hemiparesis following unspecified cerebrovascular disease affecting right dominant side: Secondary | ICD-10-CM | POA: Diagnosis not present

## 2021-07-24 DIAGNOSIS — I69998 Other sequelae following unspecified cerebrovascular disease: Secondary | ICD-10-CM | POA: Diagnosis not present

## 2021-10-23 DIAGNOSIS — E785 Hyperlipidemia, unspecified: Secondary | ICD-10-CM | POA: Diagnosis not present

## 2021-10-23 DIAGNOSIS — I10 Essential (primary) hypertension: Secondary | ICD-10-CM | POA: Diagnosis not present

## 2021-10-23 DIAGNOSIS — G4733 Obstructive sleep apnea (adult) (pediatric): Secondary | ICD-10-CM | POA: Diagnosis not present

## 2021-10-23 DIAGNOSIS — Z8673 Personal history of transient ischemic attack (TIA), and cerebral infarction without residual deficits: Secondary | ICD-10-CM | POA: Diagnosis not present

## 2021-10-31 ENCOUNTER — Ambulatory Visit (INDEPENDENT_AMBULATORY_CARE_PROVIDER_SITE_OTHER): Payer: BC Managed Care – PPO | Admitting: Medical-Surgical

## 2021-10-31 ENCOUNTER — Encounter: Payer: Self-pay | Admitting: Medical-Surgical

## 2021-10-31 VITALS — BP 137/81 | HR 66 | Temp 98.4°F | Resp 20 | Ht 76.0 in | Wt 235.7 lb

## 2021-10-31 DIAGNOSIS — J069 Acute upper respiratory infection, unspecified: Secondary | ICD-10-CM

## 2021-10-31 MED ORDER — AMOXICILLIN-POT CLAVULANATE 875-125 MG PO TABS: 1.0000 | ORAL_TABLET | Freq: Two times a day (BID) | ORAL | 0 refills | Status: DC

## 2021-10-31 MED ORDER — LEVOCETIRIZINE DIHYDROCHLORIDE 5 MG PO TABS: 5.0000 mg | ORAL_TABLET | Freq: Every evening | ORAL | 1 refills | Status: DC

## 2021-10-31 MED ORDER — FLUTICASONE PROPIONATE 50 MCG/ACT NA SUSP: 2.0000 | Freq: Every day | NASAL | 6 refills | Status: DC

## 2021-10-31 MED ORDER — IPRATROPIUM BROMIDE 0.03 % NA SOLN: 2.0000 | Freq: Two times a day (BID) | NASAL | 0 refills | Status: DC

## 2021-10-31 NOTE — Progress Notes (Signed)
?  HPI with pertinent ROS:  ? ?CC: URI symptoms ? ?HPI: ?Pleasant 46 year old male presenting today for evaluation of upper respiratory symptoms.  Notes that he started last Friday with head congestion, rhinorrhea, cough, sinus drainage, postnasal drip, and sinus pressure.  Notes he has also had an itchy throat but it is not sore.  Denies fever, chills, shortness of breath, chest pain, and GI symptoms.  Has been taking NyQuil and DayQuil which only provide mild temporary relief of symptoms.  Has a 68 year old daughter who had similar symptoms last week.  No prior history of seasonal allergies. ? ?I reviewed the past medical history, family history, social history, surgical history, and allergies today and no changes were needed.  Please see the problem list section below in epic for further details. ? ? ?Physical exam:  ? ?General: Well Developed, well nourished, and in no acute distress.  ?Neuro: Alert and oriented x3.  ?HEENT: Normocephalic, atraumatic, pupils equal round reactive to light, neck supple, no masses, no lymphadenopathy, thyroid nonpalpable.  Nasal mucosa erythematous with clear drainage.  Shotty lymphadenopathy.  Mild posterior oropharyngeal erythema, clear exudate.  Bilateral external ear canals patent, TMs normal. ?Skin: Warm and dry. ?Cardiac: Regular rate and rhythm, no murmurs rubs or gallops, no lower extremity edema.  ?Respiratory: Clear to auscultation bilaterally. Not using accessory muscles, speaking in full sentences. ? ?Impression and Recommendations:   ? ?1. Upper respiratory tract infection, unspecified type ?Unclear etiology.  Consider viral infection versus allergic rhinitis.  Approximately 6 days of upper respiratory symptoms with clear nasal discharge and postnasal drip.  Suggest starting Flonase as well as Xyzal since he is not currently taking an antihistamine.  Sending in Atrovent for management of rhinorrhea.  He is going out of town next week so I will go ahead and prescribe a  prescription for Augmentin twice daily x7 days.  Advised him to hold off on starting this medicine for 2 to 3 days and treat symptomatically in the meantime.  If symptoms worsen or fail to improve at that time, start the medication and take it to its entirety.  Patient verbalized understanding and is agreeable to the plan. ?- ipratropium (ATROVENT) 0.03 % nasal spray; Place 2 sprays into both nostrils every 12 (twelve) hours.  Dispense: 30 mL; Refill: 0 ?- fluticasone (FLONASE) 50 MCG/ACT nasal spray; Place 2 sprays into both nostrils daily.  Dispense: 16 g; Refill: 6 ?- amoxicillin-clavulanate (AUGMENTIN) 875-125 MG tablet; Take 1 tablet by mouth 2 (two) times daily.  Dispense: 14 tablet; Refill: 0 ? ?Return if symptoms worsen or fail to improve. ?___________________________________________ ?Thayer Ohm, DNP, APRN, FNP-BC ?Primary Care and Sports Medicine ?[facility] ?

## 2021-11-08 ENCOUNTER — Encounter: Payer: Self-pay | Admitting: Family Medicine

## 2021-11-08 ENCOUNTER — Ambulatory Visit (INDEPENDENT_AMBULATORY_CARE_PROVIDER_SITE_OTHER): Payer: BC Managed Care – PPO | Admitting: Family Medicine

## 2021-11-08 VITALS — BP 134/86 | HR 62 | Ht 76.0 in | Wt 238.0 lb

## 2021-11-08 DIAGNOSIS — I10 Essential (primary) hypertension: Secondary | ICD-10-CM | POA: Diagnosis not present

## 2021-11-08 DIAGNOSIS — E785 Hyperlipidemia, unspecified: Secondary | ICD-10-CM | POA: Diagnosis not present

## 2021-11-08 MED ORDER — LISINOPRIL 10 MG PO TABS: 10.0000 mg | ORAL_TABLET | Freq: Every day | ORAL | 3 refills | Status: DC

## 2021-11-08 NOTE — Assessment & Plan Note (Signed)
BP elevated today. Increasing lisinopril to 10mg  daily.  Return in 2-3 weeks for nurse visit to recheck BP.  6 month f/u with me.

## 2021-11-08 NOTE — Progress Notes (Signed)
Shawn Richards - 46 y.o. male MRN [id number]  Date of birth: [birth year]  Subjective No chief complaint on file.   HPI Shawn Richards is a 46 y.o. male here today for follow up visit. Reports that he is doing well today.  BP is elevated today.  Has had some elevated readings at home as well.  He is taking lisinopril 5mg  daily.  No side effects at current strength.  He has not had chest pain, shortness of breath, palpitations, headache or vision changes.   History of CVA as well and taking lipitor.  Tolerating this well, however reports having elevation in LFT with lipitor in the past.   ROS:  A comprehensive ROS was completed and negative except as noted per HPI  No Known Allergies  Past Medical History:  Diagnosis Date   Fatty liver    Hyperlipidemia    Hypertension 2006   treated for only 6 months   Hypotestosteronism    Obstructive sleep apnea 05/30/2019   Sleep study 05/2019    Past Surgical History:  Procedure Laterality Date   sinus polyps  1995   TYMPANOSTOMY TUBE PLACEMENT      Social History   Socioeconomic History   Marital status: Married    Spouse name: Not on file   Number of children: Not on file   Years of education: Not on file   Highest education level: Not on file  Occupational History   Not on file  Tobacco Use   Smoking status: Never   Smokeless tobacco: Never  Vaping Use   Vaping Use: Never used  Substance and Sexual Activity   Alcohol use: Yes    Alcohol/week: 2.0 standard drinks    Types: 2 Standard drinks or equivalent per week   Drug use: Not Currently   Sexual activity: Yes    Birth control/protection: Surgical  Other Topics Concern   Not on file  Social History Narrative   Not on file   Social Determinants of Health   Financial Resource Strain: Not on file  Food Insecurity: Not on file  Transportation Needs: Not on file  Physical Activity: Not on file  Stress: Not on file  Social Connections: Not on file    Family  History  Problem Relation Age of Onset   Hyperlipidemia Mother    Prostate cancer Father    Cancer Father        prostate   Hyperlipidemia Father    Dementia Father    Colon cancer Neg Hx    Colon polyps Neg Hx    Esophageal cancer Neg Hx    Rectal cancer Neg Hx    Stomach cancer Neg Hx     Health Maintenance  Topic Date Due   COVID-19 Vaccine (1) Never done   HIV Screening  Never done   Hepatitis C Screening  Never done   INFLUENZA VACCINE  01/21/2022   TETANUS/TDAP  05/05/2030   COLONOSCOPY (Pts 45-7yrs Insurance coverage will need to be confirmed)  04/18/2031   HPV VACCINES  Aged Out     ----------------------------------------------------------------------------------------------------------------------------------------------------------------------------------------------------------------- Physical Exam BP (!) 148/90 (BP Location: Right Arm, Patient Position: Sitting, Cuff Size: Large)   Pulse 62   Ht 6\' 4"  (1.93 m)   Wt 238 lb (108 kg)   SpO2 96%   BMI 28.97 kg/m   Physical Exam  ------------------------------------------------------------------------------------------------------------------------------------------------------------------------------------------------------------------- Assessment and Plan  Hyperlipidemia Tolerating lipitor well.  Updating lipid panel and hepatic panel.   Essential hypertension BP elevated today. Increasing  lisinopril to 10mg  daily.  Return in 2-3 weeks for nurse visit to recheck BP.  6 month f/u with me.    No orders of the defined types were placed in this encounter.   No follow-ups on file.    This visit occurred during the SARS-CoV-2 public health emergency.  Safety protocols were in place, including screening questions prior to the visit, additional usage of staff PPE, and extensive cleaning of exam room while observing appropriate contact time as indicated for disinfecting solutions.

## 2021-11-08 NOTE — Assessment & Plan Note (Signed)
Tolerating lipitor well.  Updating lipid panel and hepatic panel.

## 2021-11-08 NOTE — Patient Instructions (Signed)
Increase lisinopril to 10mg  daily.  Return in 2-3 weeks to recheck BP.  See me again in 6 months or sooner if needed.

## 2021-11-09 LAB — LIPID PANEL W/REFLEX DIRECT LDL
Cholesterol: 189 mg/dL (ref <200)
HDL: 46 mg/dL (ref >=40)
LDL Cholesterol (Calc): 126 mg/dL (calc) — ABNORMAL HIGH
Non-HDL Cholesterol (Calc): 143 mg/dL (calc) — ABNORMAL HIGH (ref <130)
Total CHOL/HDL Ratio: 4.1 (calc) (ref <5.0)
Triglycerides: 73 mg/dL (ref <150)

## 2021-11-09 LAB — HEPATIC FUNCTION PANEL
AG Ratio: 2.3 (calc) (ref 1.0–2.5)
ALT: 67 U/L — ABNORMAL HIGH (ref 9–46)
AST: 31 U/L (ref 10–40)
Albumin: 4.6 g/dL (ref 3.6–5.1)
Alkaline phosphatase (APISO): 46 U/L (ref 36–130)
Bilirubin, Direct: 0.2 mg/dL (ref 0.0–0.2)
Globulin: 2 g/dL (calc) (ref 1.9–3.7)
Indirect Bilirubin: 1.1 mg/dL (calc) (ref 0.2–1.2)
Total Bilirubin: 1.3 mg/dL — ABNORMAL HIGH (ref 0.2–1.2)
Total Protein: 6.6 g/dL (ref 6.1–8.1)

## 2021-11-15 DIAGNOSIS — R1319 Other dysphagia: Secondary | ICD-10-CM | POA: Diagnosis not present

## 2021-11-22 DIAGNOSIS — R1319 Other dysphagia: Secondary | ICD-10-CM | POA: Diagnosis not present

## 2021-11-22 DIAGNOSIS — K222 Esophageal obstruction: Secondary | ICD-10-CM | POA: Diagnosis not present

## 2021-11-23 ENCOUNTER — Other Ambulatory Visit: Payer: Self-pay | Admitting: Medical-Surgical

## 2021-11-23 DIAGNOSIS — J069 Acute upper respiratory infection, unspecified: Secondary | ICD-10-CM

## 2021-11-29 ENCOUNTER — Other Ambulatory Visit: Payer: Self-pay | Admitting: Family Medicine

## 2021-11-29 ENCOUNTER — Ambulatory Visit (INDEPENDENT_AMBULATORY_CARE_PROVIDER_SITE_OTHER): Payer: BC Managed Care – PPO | Admitting: Family Medicine

## 2021-11-29 DIAGNOSIS — I10 Essential (primary) hypertension: Secondary | ICD-10-CM | POA: Diagnosis not present

## 2021-11-29 MED ORDER — LISINOPRIL 20 MG PO TABS: 20.0000 mg | ORAL_TABLET | Freq: Every day | ORAL | 2 refills | Status: DC

## 2021-11-29 NOTE — Progress Notes (Signed)
Pt presents to clinic today for BP check. Denies any cp/sob/palpitaions/headaches/dizziness or swelling.    Blood pressure still elevated on 2nd check. Pt advised to double Lisinopril to 20 mg daily and f/u in 2 weeks to have his bp checked with nurse.  Pt voiced understanding and agreed to plan.

## 2021-11-29 NOTE — Progress Notes (Signed)
Medical screening examination/treatment was performed by qualified clinical staff member and as supervising physician I was immediately available for consultation/collaboration. I have reviewed documentation and agree with assessment and plan.  Radiance Deady, DO  

## 2021-11-29 NOTE — Progress Notes (Signed)
Medical screening examination/treatment was performed by qualified clinical staff member and as supervising physician I was immediately available for consultation/collaboration. I have reviewed documentation and agree with assessment and plan.  Adi Doro, DO  

## 2021-12-13 ENCOUNTER — Ambulatory Visit (INDEPENDENT_AMBULATORY_CARE_PROVIDER_SITE_OTHER): Payer: BC Managed Care – PPO | Admitting: Family Medicine

## 2021-12-13 DIAGNOSIS — I10 Essential (primary) hypertension: Secondary | ICD-10-CM
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
## 2022-05-12 ENCOUNTER — Encounter: Payer: Self-pay | Admitting: Family Medicine

## 2022-05-12 ENCOUNTER — Ambulatory Visit (INDEPENDENT_AMBULATORY_CARE_PROVIDER_SITE_OTHER): Payer: BC Managed Care – PPO | Admitting: Family Medicine

## 2022-05-12 VITALS — BP 149/77 | HR 57 | Ht 76.0 in | Wt 249.0 lb

## 2022-05-12 DIAGNOSIS — E785 Hyperlipidemia, unspecified: Secondary | ICD-10-CM | POA: Diagnosis not present

## 2022-05-12 DIAGNOSIS — I10 Essential (primary) hypertension: Secondary | ICD-10-CM | POA: Diagnosis not present

## 2022-05-12 NOTE — Assessment & Plan Note (Signed)
He has increased his atorvastatin. Updating lipid panel.

## 2022-05-12 NOTE — Assessment & Plan Note (Signed)
BP is elevated today.  Recommend that he monitor this at home for the next 2 weeks and send me readings.

## 2022-05-12 NOTE — Patient Instructions (Signed)
Monitor blood pressure over the next 2 weeks and send to me via MyChart.  See me again in 6 months.

## 2022-05-12 NOTE — Progress Notes (Signed)
Shawn Richards - 46 y.o. male MRN [id number]  Date of birth: Sep 06, 1975  Subjective Chief Complaint  Patient presents with   Hypertension    HPI Shawn Richards is a 46 y.o. male here today for follow up visit.   Reports that he is feeling well.    Continues on lisinopril 20mg  daily.  Tolerating well.  BP is up some at this visit.  Has historically been well controlled with this.  He has not checked BP at home.  He denies symptoms related HTN including chest pain, shortness of breath, palpitations,. headache or vision changes.   He has increased his atorvastatin. Tolerating change well.  He is fasting to have this rechecked today.    ROS:  A comprehensive ROS was completed and negative except as noted per HPI  No Known Allergies  Past Medical History:  Diagnosis Date   Fatty liver    Hyperlipidemia    Hypertension 2006   treated for only 6 months   Hypotestosteronism    Obstructive sleep apnea 05/30/2019   Sleep study 05/2019    Past Surgical History:  Procedure Laterality Date   sinus polyps  1995   TYMPANOSTOMY TUBE PLACEMENT      Social History   Socioeconomic History   Marital status: Married    Spouse name: Not on file   Number of children: Not on file   Years of education: Not on file   Highest education level: Not on file  Occupational History   Not on file  Tobacco Use   Smoking status: Never   Smokeless tobacco: Never  Vaping Use   Vaping Use: Never used  Substance and Sexual Activity   Alcohol use: Yes    Alcohol/week: 2.0 standard drinks of alcohol    Types: 2 Standard drinks or equivalent per week   Drug use: Not Currently   Sexual activity: Yes    Birth control/protection: Surgical  Other Topics Concern   Not on file  Social History Narrative   Not on file   Social Determinants of Health   Financial Resource Strain: Not on file  Food Insecurity: Not on file  Transportation Needs: Not on file  Physical Activity: Not on file   Stress: Not on file  Social Connections: Not on file    Family History  Problem Relation Age of Onset   Hyperlipidemia Mother    Prostate cancer Father    Cancer Father        prostate   Hyperlipidemia Father    Dementia Father    Colon cancer Neg Hx    Colon polyps Neg Hx    Esophageal cancer Neg Hx    Rectal cancer Neg Hx    Stomach cancer Neg Hx     Health Maintenance  Topic Date Due   COVID-19 Vaccine (1) 08/22/2022 (Originally 01/19/1976)   INFLUENZA VACCINE  09/21/2022 (Originally 01/21/2022)   Hepatitis C Screening  05/13/2023 (Originally 07/21/1992)   HIV Screening  05/13/2023 (Originally 07/21/1989)   COLONOSCOPY (Pts 45-36yrs Insurance coverage will need to be confirmed)  04/18/2031   HPV VACCINES  Aged Out     ----------------------------------------------------------------------------------------------------------------------------------------------------------------------------------------------------------------- Physical Exam BP (!) 149/77 (BP Location: Right Arm, Patient Position: Sitting, Cuff Size: Large)   Pulse (!) 57   Ht 6\' 4"  (1.93 m)   Wt 249 lb (112.9 kg)   SpO2 98%   BMI 30.31 kg/m   Physical Exam Constitutional:      Appearance: Normal appearance.  HENT:  Head: Normocephalic and atraumatic.  Eyes:     General: No scleral icterus. Cardiovascular:     Rate and Rhythm: Normal rate and regular rhythm.  Pulmonary:     Effort: Pulmonary effort is normal.     Breath sounds: Normal breath sounds.  Musculoskeletal:     Cervical back: Neck supple.  Neurological:     Mental Status: He is alert.  Psychiatric:        Mood and Affect: Mood normal.        Behavior: Behavior normal.     ------------------------------------------------------------------------------------------------------------------------------------------------------------------------------------------------------------------- Assessment and Plan  Essential hypertension BP  is elevated today.  Recommend that he monitor this at home for the next 2 weeks and send me readings.    Hyperlipidemia He has increased his atorvastatin. Updating lipid panel.    No orders of the defined types were placed in this encounter.   Return in about 6 months (around 11/10/2022) for HTN.    This visit occurred during the SARS-CoV-2 public health emergency.  Safety protocols were in place, including screening questions prior to the visit, additional usage of staff PPE, and extensive cleaning of exam room while observing appropriate contact time as indicated for disinfecting solutions.

## 2022-05-13 LAB — HEPATIC FUNCTION PANEL
AG Ratio: 2.4 (calc) (ref 1.0–2.5)
ALT: 53 U/L — ABNORMAL HIGH (ref 9–46)
AST: 27 U/L (ref 10–40)
Albumin: 4.7 g/dL (ref 3.6–5.1)
Alkaline phosphatase (APISO): 44 U/L (ref 36–130)
Bilirubin, Direct: 0.2 mg/dL (ref 0.0–0.2)
Globulin: 2 g/dL (calc) (ref 1.9–3.7)
Indirect Bilirubin: 1.2 mg/dL (calc) (ref 0.2–1.2)
Total Bilirubin: 1.4 mg/dL — ABNORMAL HIGH (ref 0.2–1.2)
Total Protein: 6.7 g/dL (ref 6.1–8.1)

## 2022-05-13 LAB — LIPID PANEL W/REFLEX DIRECT LDL
Cholesterol: 169 mg/dL (ref <200)
HDL: 49 mg/dL (ref >=40)
LDL Cholesterol (Calc): 103 mg/dL (calc) — ABNORMAL HIGH
Non-HDL Cholesterol (Calc): 120 mg/dL (calc) (ref <130)
Total CHOL/HDL Ratio: 3.4 (calc) (ref <5.0)
Triglycerides: 80 mg/dL (ref <150)

## 2022-05-29 ENCOUNTER — Encounter: Payer: Self-pay | Admitting: Family Medicine

## 2022-05-29 ENCOUNTER — Ambulatory Visit (INDEPENDENT_AMBULATORY_CARE_PROVIDER_SITE_OTHER): Payer: BC Managed Care – PPO | Admitting: Family Medicine

## 2022-05-29 VITALS — BP 137/78 | HR 68 | Ht 76.0 in | Wt 240.0 lb

## 2022-05-29 DIAGNOSIS — M79672 Pain in left foot: Secondary | ICD-10-CM

## 2022-05-29 DIAGNOSIS — M109 Gout, unspecified: Secondary | ICD-10-CM

## 2022-05-29 MED ORDER — INDOMETHACIN 50 MG PO CAPS: 50.0000 mg | ORAL_CAPSULE | Freq: Three times a day (TID) | ORAL | 1 refills | Status: DC

## 2022-05-29 NOTE — Progress Notes (Signed)
Shawn Richards - 46 y.o. male MRN [id number]  Date of birth: [birth year]  Subjective Chief Complaint  Patient presents with   Foot Swelling    HPI Shawn Richards is a 46 y.o. male here today with complaint of left foot pain and swelling.  Pain under the bottom of the foot.  Symptoms started about 1 week ago.  Notes some redness without significant warmth.  He doesn't recall any particular injury.  He has tried ibuprofen with some relief. .No prior history of gout but has several family members who have had gout.   ROS:  A comprehensive ROS was completed and negative except as noted per HPI   No Known Allergies  Past Medical History:  Diagnosis Date   Fatty liver    Hyperlipidemia    Hypertension 2006   treated for only 6 months   Hypotestosteronism    Obstructive sleep apnea 05/30/2019   Sleep study 05/2019    Past Surgical History:  Procedure Laterality Date   sinus polyps  1995   TYMPANOSTOMY TUBE PLACEMENT      Social History   Socioeconomic History   Marital status: Married    Spouse name: Not on file   Number of children: Not on file   Years of education: Not on file   Highest education level: Not on file  Occupational History   Not on file  Tobacco Use   Smoking status: Never   Smokeless tobacco: Never  Vaping Use   Vaping Use: Never used  Substance and Sexual Activity   Alcohol use: Yes    Alcohol/week: 2.0 standard drinks of alcohol    Types: 2 Standard drinks or equivalent per week   Drug use: Not Currently   Sexual activity: Yes    Birth control/protection: Surgical  Other Topics Concern   Not on file  Social History Narrative   Not on file   Social Determinants of Health   Financial Resource Strain: Not on file  Food Insecurity: Not on file  Transportation Needs: Not on file  Physical Activity: Not on file  Stress: Not on file  Social Connections: Not on file    Family History  Problem Relation Age of Onset   Hyperlipidemia Mother     Prostate cancer Father    Cancer Father        prostate   Hyperlipidemia Father    Dementia Father    Colon cancer Neg Hx    Colon polyps Neg Hx    Esophageal cancer Neg Hx    Rectal cancer Neg Hx    Stomach cancer Neg Hx     Health Maintenance  Topic Date Due   COVID-19 Vaccine (1) 08/22/2022 (Originally 01/19/1975)   INFLUENZA VACCINE  09/21/2022 (Originally 01/21/2022)   Hepatitis C Screening  05/13/2023 (Originally 07/21/1992)   HIV Screening  05/13/2023 (Originally 07/21/1989)   DTaP/Tdap/Td (2 - Td or Tdap) 05/05/2030   COLONOSCOPY (Pts 45-27yrs Insurance coverage will need to be confirmed)  04/18/2031   HPV VACCINES  Aged Out     ----------------------------------------------------------------------------------------------------------------------------------------------------------------------------------------------------------------- Physical Exam BP 137/78 (BP Location: Left Arm, Patient Position: Sitting, Cuff Size: Large)   Pulse 68   Ht 6\' 4"  (1.93 m)   Wt 240 lb (108.9 kg)   SpO2 96%   BMI 29.21 kg/m   Physical Exam Constitutional:      Appearance: Normal appearance.  HENT:     Head: Normocephalic and atraumatic.  Musculoskeletal:     Comments: Swelling with TTP  along the L MTP joint of the great toe.    Neurological:     Mental Status: He is alert.  Psychiatric:        Mood and Affect: Mood normal.        Behavior: Behavior normal.     ------------------------------------------------------------------------------------------------------------------------------------------------------------------------------------------------------------------- Assessment and Plan  Gout Checking uric acid levels.  Adding indomethacin 50mg  TID as needed.  Given handout on gout and dietary changes.    Meds ordered this encounter  Medications   indomethacin (INDOCIN) 50 MG capsule    Sig: Take 1 capsule (50 mg total) by mouth 3 (three) times daily with meals.     Dispense:  30 capsule    Refill:  1    No follow-ups on file.    This visit occurred during the SARS-CoV-2 public health emergency.  Safety protocols were in place, including screening questions prior to the visit, additional usage of staff PPE, and extensive cleaning of exam room while observing appropriate contact time as indicated for disinfecting solutions.

## 2022-05-29 NOTE — Assessment & Plan Note (Signed)
Checking uric acid levels.  Adding indomethacin 50mg  TID as needed.  Given handout on gout and dietary changes.

## 2022-05-29 NOTE — Patient Instructions (Signed)
Gout  Gout is painful swelling of your joints. Gout is a type of arthritis. It is caused by having too much uric acid in your body. Uric acid is a chemical that is made when your body breaks down substances called purines. If your body has too much uric acid, sharp crystals can form and build up in your joints. This causes pain and swelling. Gout attacks can happen quickly and be very painful (acute gout). Over time, the attacks can affect more joints and happen more often (chronic gout). What are the causes? Gout is caused by too much uric acid in your blood. This can happen because: Your kidneys do not remove enough uric acid from your blood. Your body makes too much uric acid. You eat too many foods that are high in purines. These foods include organ meats, some seafood, and beer. Trauma or stress can bring on an attack. What increases the risk? Having a family history of gout. Being male and middle-aged. Being male and having gone through menopause. Having an organ transplant. Taking certain medicines. Having certain conditions, such as: Being very overweight (obese). Lead poisoning. Kidney disease. A skin condition called psoriasis. Other risks include: Losing weight too quickly. Not having enough water in the body (being dehydrated). Drinking alcohol, especially beer. Drinking beverages that are sweetened with a type of sugar called fructose. What are the signs or symptoms? An attack of acute gout often starts at night and usually happens in just one joint. The most common place is the big toe. Other joints that may be affected include joints of the feet, ankle, knee, fingers, wrist, or elbow. Symptoms may include: Very bad pain. Warmth. Swelling. Stiffness. Tenderness. The affected joint may be very painful to touch. Shiny, red, or purple skin. Chills and fever. Chronic gout may cause symptoms more often. More joints may be involved. You may also have white or yellow lumps  (tophi) on your hands or feet or in other areas near your joints. How is this treated? Treatment for an acute attack may include medicines for pain and swelling, such as: NSAIDs, such as ibuprofen. Steroids taken by mouth or injected into a joint. Colchicine. This can be given by mouth or through an IV tube. Treatment to prevent future attacks may include: Taking small doses of NSAIDs or colchicine daily. Using a medicine that reduces uric acid levels in your blood, such as allopurinol. Making changes to your diet. You may need to see a food expert (dietitian) about what to eat and drink to prevent gout. Follow these instructions at home: During a gout attack  If told, put ice on the painful area. To do this: Put ice in a plastic bag. Place a towel between your skin and the bag. Leave the ice on for 20 minutes, 2-3 times a day. Take off the ice if your skin turns bright red. This is very important. If you cannot feel pain, heat, or cold, you have a greater risk of damage to the area. Raise the painful joint above the level of your heart as often as you can. Rest the joint as much as possible. If the joint is in your leg, you may be given crutches. Follow instructions from your doctor about what you cannot eat or drink. Avoiding future gout attacks Eat a low-purine diet. Avoid foods and drinks such as: Liver. Kidney. Anchovies. Asparagus. Herring. Mushrooms. Mussels. Beer. Stay at a healthy weight. If you want to lose weight, talk with your doctor. Do not   lose weight too fast. Start or continue an exercise plan as told by your doctor. Eating and drinking Avoid drinks sweetened by fructose. Drink enough fluids to keep your pee (urine) pale yellow. If you drink alcohol: Limit how much you have to: 0-1 drink a day for women who are not pregnant. 0-2 drinks a day for men. Know how much alcohol is in a drink. In the [place] one drink equals one 12 oz bottle of beer (355 mL), one 5 oz  glass of wine (148 mL), or one 1 oz glass of hard liquor (44 mL). General instructions Take over-the-counter and prescription medicines only as told by your doctor. Ask your doctor if you should avoid driving or using machines while you are taking your medicine. Return to your normal activities when your doctor says that it is safe. Keep all follow-up visits. Where to find more information National Institutes of Health: www.niams.nih.gov Contact a doctor if: You have another gout attack. You still have symptoms of a gout attack after 10 days of treatment. You have problems (side effects) because of your medicines. You have chills or a fever. You have burning pain when you pee (urinate). You have pain in your lower back or belly. Get help right away if: You have very bad pain. Your pain cannot be controlled. You cannot pee. Summary Gout is painful swelling of the joints. The most common site of pain is the big toe, but it can affect other joints. Medicines and avoiding some foods can help to prevent and treat gout attacks. This information is not intended to replace advice given to you by your health care provider. Make sure you discuss any questions you have with your health care provider. Document Revised: 03/13/2021 Document Reviewed: 03/13/2021 Elsevier Patient Education  2023 Elsevier Inc.  

## 2022-05-30 LAB — URIC ACID: Uric Acid, Serum: 7.3 mg/dL (ref 4.0–8.0)

## 2022-06-19 ENCOUNTER — Encounter: Payer: Self-pay | Admitting: Medical-Surgical

## 2022-06-19 ENCOUNTER — Ambulatory Visit (INDEPENDENT_AMBULATORY_CARE_PROVIDER_SITE_OTHER): Payer: BC Managed Care – PPO | Admitting: Medical-Surgical

## 2022-06-19 ENCOUNTER — Ambulatory Visit (INDEPENDENT_AMBULATORY_CARE_PROVIDER_SITE_OTHER): Payer: BC Managed Care – PPO

## 2022-06-19 VITALS — BP 146/75 | HR 79 | Temp 98.4°F | Resp 20 | Ht 76.0 in | Wt 244.7 lb

## 2022-06-19 DIAGNOSIS — R059 Cough, unspecified: Secondary | ICD-10-CM | POA: Diagnosis not present

## 2022-06-19 DIAGNOSIS — R053 Chronic cough: Secondary | ICD-10-CM

## 2022-06-19 DIAGNOSIS — J329 Chronic sinusitis, unspecified: Secondary | ICD-10-CM

## 2022-06-19 DIAGNOSIS — J40 Bronchitis, not specified as acute or chronic: Secondary | ICD-10-CM | POA: Diagnosis not present

## 2022-06-19 MED ORDER — AMOXICILLIN-POT CLAVULANATE 875-125 MG PO TABS: 1.0000 | ORAL_TABLET | Freq: Two times a day (BID) | ORAL | 0 refills | Status: DC

## 2022-06-19 MED ORDER — PREDNISONE 50 MG PO TABS: 50.0000 mg | ORAL_TABLET | Freq: Every day | ORAL | 0 refills | Status: DC

## 2022-06-19 NOTE — Progress Notes (Signed)
   Established Patient Office Visit  Subjective   Patient ID: Shawn Richards, male   DOB: [birth year] Age: 46 y.o. MRN: [id number]   Chief Complaint  Patient presents with   Cough   Wheezing    HPI Pleasant 46 year old male presenting today for evaluation of upper respiratory symptoms that have been present for the last 3-4 weeks.  Notes that several family members have been sick with similar symptoms however his son developed walking pneumonia which required antibiotics.  His symptoms started shortly after his son got sick and have persisted.  His most concerning symptom is a nonproductive cough along with intermittent wheezing.  He does still have some sinus congestion but has had no fevers, sore throat, shortness of breath, or GI symptoms.  Has been using NyQuil at bedtime which does seem to help with his symptoms.  His wife reports that he still coughs however he is able to sleep fairly decent without frequent waking.   Objective:    Vitals:   06/19/22 1600  BP: (!) 146/75  Pulse: 79  Temp: 98.4 F (36.9 C)  Resp: 20  Height: 6\' 4"  (1.93 m)  Weight: 244 lb 11.2 oz (111 kg)  SpO2: 98%  BMI (Calculated): 29.8   Physical Exam Vitals reviewed.  Constitutional:      General: He is not in acute distress.    Appearance: Normal appearance. He is obese. He is not ill-appearing.  HENT:     Head: Normocephalic and atraumatic.  Cardiovascular:     Rate and Rhythm: Normal rate and regular rhythm.     Pulses: Normal pulses.     Heart sounds: Normal heart sounds. No murmur heard.    No friction rub. No gallop.  Pulmonary:     Effort: Pulmonary effort is normal. No respiratory distress.     Breath sounds: Normal breath sounds.  Musculoskeletal:        General: Swelling: .diagm.  Skin:    General: Skin is warm and dry.  Neurological:     Mental Status: He is alert and oriented to person, place, and time.  Psychiatric:        Mood and Affect: Mood normal.        Behavior:  Behavior normal.        Thought Content: Thought content normal.        Judgment: Judgment normal.   No results found for this or any previous visit (from the past 24 hour(s)).     The ASCVD Risk score (Arnett DK, et al., 2019) failed to calculate for the following reasons:   The patient has a prior MI or stroke diagnosis   Assessment & Plan:   1. Sinobronchitis Since he has had exposure to walking pneumonia, we will going get a chest x-ray today.  Exam reassuring, moving good air without wheezing.  Considering the duration of his symptoms for 3 to 4 weeks and continued sinus congestion, we will go ahead and treat for sinobronchitis with Augmentin twice daily x 7 days.  Adding a burst of prednisone x 5 days.  Okay to continue using NyQuil at night if this is beneficial. - DG Chest 2 View; Future  Return if symptoms worsen or fail to improve.  ___________________________________________ 2020, DNP, APRN, FNP-BC Primary Care and Sports Medicine [facility]

## 2022-07-26 ENCOUNTER — Other Ambulatory Visit: Payer: Self-pay | Admitting: Family Medicine

## 2022-10-22 ENCOUNTER — Other Ambulatory Visit: Payer: Self-pay | Admitting: Family Medicine

## 2022-10-22 NOTE — Telephone Encounter (Signed)
Pls contact patient to schedule 48-month HTN follow-up. Sending 30 day med refill. Thanks

## 2022-10-23 NOTE — Telephone Encounter (Signed)
Called patient, left voicemail to call office to schedule appointment, thanks.  

## 2022-11-17 ENCOUNTER — Other Ambulatory Visit: Payer: Self-pay | Admitting: Family Medicine

## 2022-11-17 DIAGNOSIS — I10 Essential (primary) hypertension: Secondary | ICD-10-CM

## 2022-11-20 ENCOUNTER — Ambulatory Visit (INDEPENDENT_AMBULATORY_CARE_PROVIDER_SITE_OTHER): Payer: BC Managed Care – PPO | Admitting: Family Medicine

## 2022-11-20 ENCOUNTER — Encounter: Payer: Self-pay | Admitting: Family Medicine

## 2022-11-20 VITALS — BP 146/91 | HR 88 | Ht 76.0 in | Wt 243.0 lb

## 2022-11-20 DIAGNOSIS — I10 Essential (primary) hypertension: Secondary | ICD-10-CM

## 2022-11-20 MED ORDER — LISINOPRIL-HYDROCHLOROTHIAZIDE 20-25 MG PO TABS: 1.0000 | ORAL_TABLET | Freq: Every day | ORAL | 3 refills | Status: DC

## 2022-11-20 NOTE — Assessment & Plan Note (Signed)
Changing lisinopril to lisinopril with hydrochlorothiazide.  Low-sodium diet encouraged.  Monitoring at home recommended.  Return in 2 weeks for blood pressure recheck.

## 2022-11-20 NOTE — Progress Notes (Signed)
NEALY Richards - 47 y.o. male MRN [id number]  Date of birth: [birth year]  Subjective Chief Complaint  Patient presents with   Follow-up    HPI Shawn Richards is a 47 year old male here today for follow-up visit.  Reports he is feeling pretty well.  Denies new concerns today.  Blood pressure is elevated.  He does not monitor his blood pressure at home.  He is taking lisinopril 20 mg daily.  He denies side effects.  He has not had any symptoms related to hypertension including chest pain, shortness of breath, palpitations, headaches or vision changes.  Tolerating atorvastatin well for management of hyperlipidemia.  ROS:  A comprehensive ROS was completed and negative except as noted per HPI  No Known Allergies  Past Medical History:  Diagnosis Date   Fatty liver    Hyperlipidemia    Hypertension 2006   treated for only 6 months   Hypotestosteronism    Obstructive sleep apnea 05/30/2019   Sleep study 05/2019    Past Surgical History:  Procedure Laterality Date   sinus polyps  1995   TYMPANOSTOMY TUBE PLACEMENT      Social History   Socioeconomic History   Marital status: Married    Spouse name: Not on file   Number of children: Not on file   Years of education: Not on file   Highest education level: Not on file  Occupational History   Not on file  Tobacco Use   Smoking status: Never   Smokeless tobacco: Never  Vaping Use   Vaping Use: Never used  Substance and Sexual Activity   Alcohol use: Yes    Alcohol/week: 2.0 standard drinks of alcohol    Types: 2 Standard drinks or equivalent per week   Drug use: Not Currently   Sexual activity: Yes    Birth control/protection: Surgical  Other Topics Concern   Not on file  Social History Narrative   Not on file   Social Determinants of Health   Financial Resource Strain: Not on file  Food Insecurity: Not on file  Transportation Needs: Not on file  Physical Activity: Not on file  Stress: Not on file  Social  Connections: Not on file    Family History  Problem Relation Age of Onset   Hyperlipidemia Mother    Prostate cancer Father    Cancer Father        prostate   Hyperlipidemia Father    Dementia Father    Colon cancer Neg Hx    Colon polyps Neg Hx    Esophageal cancer Neg Hx    Rectal cancer Neg Hx    Stomach cancer Neg Hx     Health Maintenance  Topic Date Due   COVID-19 Vaccine (1) 12/06/2022 (Originally 01/19/1976)   Hepatitis C Screening  05/13/2023 (Originally 07/21/1992)   HIV Screening  05/13/2023 (Originally 07/21/1989)   INFLUENZA VACCINE  01/22/2023   DTaP/Tdap/Td (2 - Td or Tdap) 05/05/2030   Colonoscopy  04/18/2031   HPV VACCINES  Aged Out     ----------------------------------------------------------------------------------------------------------------------------------------------------------------------------------------------------------------- Physical Exam BP (!) 146/91   Pulse 88   Ht 6\' 4"  (1.93 m)   Wt 243 lb (110.2 kg)   SpO2 98%   BMI 29.58 kg/m   Physical Exam Constitutional:      Appearance: Normal appearance.  Eyes:     General: No scleral icterus. Neurological:     General: No focal deficit present.     Mental Status: He is alert.  Psychiatric:  Mood and Affect: Mood normal.        Behavior: Behavior normal.     ------------------------------------------------------------------------------------------------------------------------------------------------------------------------------------------------------------------- Assessment and Plan  Essential hypertension Changing lisinopril to lisinopril with hydrochlorothiazide.  Low-sodium diet encouraged.  Monitoring at home recommended.  Return in 2 weeks for blood pressure recheck.   Meds ordered this encounter  Medications   lisinopril-hydrochlorothiazide (ZESTORETIC) 20-25 MG tablet    Sig: Take 1 tablet by mouth daily.    Dispense:  90 tablet    Refill:  3    Return in  about 6 months (around 05/23/2023) for Annual exam/Fasting labs.    This visit occurred during the SARS-CoV-2 public health emergency.  Safety protocols were in place, including screening questions prior to the visit, additional usage of staff PPE, and extensive cleaning of exam room while observing appropriate contact time as indicated for disinfecting solutions.

## 2022-11-20 NOTE — Patient Instructions (Addendum)
Stop lisinopril.  Start lisinopril/hctz 20/25mg  daily.  Return for nurse visit in 2 weeks to recheck BP.  Monitor BP at home  See me again in about 6 months for annual exam/fasting labs.

## 2022-12-04 ENCOUNTER — Ambulatory Visit (INDEPENDENT_AMBULATORY_CARE_PROVIDER_SITE_OTHER): Payer: BC Managed Care – PPO | Admitting: Family Medicine

## 2022-12-04 VITALS — BP 115/74 | HR 64

## 2022-12-04 DIAGNOSIS — I10 Essential (primary) hypertension: Secondary | ICD-10-CM | POA: Diagnosis not present

## 2022-12-04 NOTE — Progress Notes (Signed)
Medical screening examination/treatment was performed by qualified clinical staff member and as supervising physician I was immediately available for consultation/collaboration. I have reviewed documentation and agree with assessment and plan.  Livi Mcgann, DO  

## 2022-12-04 NOTE — Progress Notes (Signed)
   Established Patient Office Visit  Subjective   Patient ID: Shawn Richards, male    DOB: 03/01/1976  Age: 47 y.o. MRN: [id number]  Chief Complaint  Patient presents with   Hypertension    HPI  Shawn Richards is here for blood pressure check. Denies chest pain, shortness of breath or dizziness.   ROS    Objective:     BP 115/74   Pulse 64   SpO2 100%    Physical Exam   No results found for any visits on 12/04/22.    The ASCVD Risk score (Arnett DK, et al., 2019) failed to calculate for the following reasons:   The patient has a prior MI or stroke diagnosis    Assessment & Plan:  Blood pressure check - Blood pressure within normal limits. Patient advised to continue current medications as directed.   Problem List Items Addressed This Visit       Unprioritized   Essential hypertension - Primary    No follow-ups on file.    Esmond Harps, CMA

## 2023-02-17 DIAGNOSIS — L237 Allergic contact dermatitis due to plants, except food: Secondary | ICD-10-CM | POA: Diagnosis not present

## 2023-07-17 ENCOUNTER — Other Ambulatory Visit: Payer: Self-pay | Admitting: Family Medicine

## 2023-07-17 NOTE — Telephone Encounter (Signed)
Pls contact the patient to schedule 6- month follow-up HTN with Dr. Ashley Royalty. Thanks

## 2023-07-17 NOTE — Telephone Encounter (Signed)
Patient scheduled for 07/22/23, thanks.

## 2023-07-22 ENCOUNTER — Encounter: Payer: Self-pay | Admitting: Family Medicine

## 2023-07-22 ENCOUNTER — Ambulatory Visit (INDEPENDENT_AMBULATORY_CARE_PROVIDER_SITE_OTHER): Payer: Managed Care, Other (non HMO) | Admitting: Family Medicine

## 2023-07-22 VITALS — BP 132/82 | HR 57 | Ht 76.0 in | Wt 262.4 lb

## 2023-07-22 DIAGNOSIS — M109 Gout, unspecified: Secondary | ICD-10-CM

## 2023-07-22 DIAGNOSIS — I10 Essential (primary) hypertension: Secondary | ICD-10-CM

## 2023-07-22 DIAGNOSIS — E785 Hyperlipidemia, unspecified: Secondary | ICD-10-CM

## 2023-07-22 DIAGNOSIS — Z8673 Personal history of transient ischemic attack (TIA), and cerebral infarction without residual deficits: Secondary | ICD-10-CM

## 2023-07-22 MED ORDER — LISINOPRIL-HYDROCHLOROTHIAZIDE 20-25 MG PO TABS: 1.0000 | ORAL_TABLET | Freq: Every day | ORAL | 3 refills | Status: AC

## 2023-07-22 NOTE — Assessment & Plan Note (Addendum)
BP is well controlled.  Will continue lisinopril/hydrochlorothiazide at current strength.

## 2023-07-22 NOTE — Assessment & Plan Note (Signed)
Continue 81mg  asa and statin.

## 2023-07-22 NOTE — Progress Notes (Signed)
Shawn Richards - 48 y.o. male MRN [id number]  Date of birth: 1975-08-08  Subjective Chief Complaint  Patient presents with   Medical Management of Chronic Issues    HPI Shawn Richards is a 48 y.o. male here today for follow up visit.   He reports that he is doing well.    He remains on lisinopril/hydrochlorothiazide for management of HTN.  BP is well controlled.  He has not had symptoms related to HTN.  He denies chest pain, shortness of breath, palpitations, headache or vision changes.   Tolerating atorvastatin well at current strength.  History of CVA.  Denies significant residual symptoms.   ROS:  A comprehensive ROS was completed and negative except as noted per HPI  No Known Allergies  Past Medical History:  Diagnosis Date   Fatty liver    Hyperlipidemia    Hypertension 2006   treated for only 6 months   Hypotestosteronism    Obstructive sleep apnea 05/30/2019   Sleep study 05/2019    Past Surgical History:  Procedure Laterality Date   sinus polyps  1995   TYMPANOSTOMY TUBE PLACEMENT      Social History   Socioeconomic History   Marital status: Married    Spouse name: Not on file   Number of children: Not on file   Years of education: Not on file   Highest education level: Not on file  Occupational History   Not on file  Tobacco Use   Smoking status: Never   Smokeless tobacco: Never  Vaping Use   Vaping status: Never Used  Substance and Sexual Activity   Alcohol use: Yes    Alcohol/week: 2.0 standard drinks of alcohol    Types: 2 Standard drinks or equivalent per week   Drug use: Not Currently   Sexual activity: Yes    Birth control/protection: Surgical  Other Topics Concern   Not on file  Social History Narrative   Not on file   Social Drivers of Health   Financial Resource Strain: Low Risk  (10/22/2021)   Received from [facility], [facility]   Overall Financial Resource Strain (CARDIA)    Difficulty of Paying Living Expenses: Not  hard at all  Food Insecurity: No Food Insecurity (10/22/2021)   Received from [facility], [facility]   Hunger Vital Sign    Worried About Running Out of Food in the Last Year: Never true    Ran Out of Food in the Last Year: Never true  Transportation Needs: No Transportation Needs (06/02/2021)   Received from [facility], [facility]   PRAPARE - Transportation    Lack of Transportation (Medical): No    Lack of Transportation (Non-Medical): No  Physical Activity: Insufficiently Active (10/22/2021)   Received from [facility], [facility]   Exercise Vital Sign    Days of Exercise per Week: 3 days    Minutes of Exercise per Session: 30 min  Stress: No Stress Concern Present (10/22/2021)   Received from [facility] - Occupational Stress Questionnaire    Feeling of Stress : Only a little  Social Connections: Unknown (11/01/2022)   Received from [facility], [facility]   Social Network    Social Network: Not on file    Family History  Problem Relation Age of Onset   Hyperlipidemia Mother    Prostate cancer Father    Cancer Father        prostate  Hyperlipidemia Father    Dementia Father    Colon cancer Neg Hx    Colon polyps Neg Hx    Esophageal cancer Neg Hx    Rectal cancer Neg Hx    Stomach cancer Neg Hx     Health Maintenance  Topic Date Due   HIV Screening  Never done   Hepatitis C Screening  Never done   INFLUENZA VACCINE  09/21/2023 (Originally 01/22/2023)   COVID-19 Vaccine (1 - 2024-25 season) 08/06/2024 (Originally 02/22/2023)   DTaP/Tdap/Td (2 - Td or Tdap) 05/05/2030   Colonoscopy  04/18/2031   HPV VACCINES  Aged Out     ----------------------------------------------------------------------------------------------------------------------------------------------------------------------------------------------------------------- Physical Exam BP 132/82 (BP Location: Right Arm, Patient  Position: Sitting, Cuff Size: Large)   Pulse (!) 57   Ht 6\' 4"  (1.93 m)   Wt 262 lb 6.4 oz (119 kg)   SpO2 99%   BMI 31.94 kg/m   Physical Exam Constitutional:      Appearance: Normal appearance.  Eyes:     General: No scleral icterus. Cardiovascular:     Rate and Rhythm: Normal rate and regular rhythm.  Pulmonary:     Effort: Pulmonary effort is normal.     Breath sounds: Normal breath sounds.  Musculoskeletal:     Cervical back: Neck supple.  Neurological:     Mental Status: He is alert.  Psychiatric:        Mood and Affect: Mood normal.        Behavior: Behavior normal.     ------------------------------------------------------------------------------------------------------------------------------------------------------------------------------------------------------------------- Assessment and Plan  Essential hypertension BP is well controlled.  Will continue lisinopril/hydrochlorothiazide at current strength.   History of CVA (cerebrovascular accident) Continue 81mg  asa and statin.   Hyperlipidemia Update lipid panel.    Meds ordered this encounter  Medications   lisinopril-hydrochlorothiazide (ZESTORETIC) 20-25 MG tablet    Sig: Take 1 tablet by mouth daily.    Dispense:  90 tablet    Refill:  3    Return in about 6 months (around 01/19/2024) for Hypertension.    This visit occurred during the SARS-CoV-2 public health emergency.  Safety protocols were in place, including screening questions prior to the visit, additional usage of staff PPE, and extensive cleaning of exam room while observing appropriate contact time as indicated for disinfecting solutions.

## 2023-07-22 NOTE — Assessment & Plan Note (Signed)
Update lipid panel.

## 2023-07-23 LAB — CMP14+EGFR
ALT: 65 [IU]/L — ABNORMAL HIGH (ref 0–44)
AST: 33 [IU]/L (ref 0–40)
Albumin: 4.6 g/dL (ref 4.1–5.1)
Alkaline Phosphatase: 61 [IU]/L (ref 44–121)
BUN/Creatinine Ratio: 15 (ref 9–20)
BUN: 17 mg/dL (ref 6–24)
Bilirubin Total: 1.1 mg/dL (ref 0.0–1.2)
CO2: 22 mmol/L (ref 20–29)
Calcium: 10 mg/dL (ref 8.7–10.2)
Chloride: 103 mmol/L (ref 96–106)
Creatinine, Ser: 1.17 mg/dL (ref 0.76–1.27)
Globulin, Total: 2.2 g/dL (ref 1.5–4.5)
Glucose: 100 mg/dL — ABNORMAL HIGH (ref 70–99)
Potassium: 4.8 mmol/L (ref 3.5–5.2)
Sodium: 140 mmol/L (ref 134–144)
Total Protein: 6.8 g/dL (ref 6.0–8.5)
eGFR: 76 mL/min/{1.73_m2} (ref >59)

## 2023-07-23 LAB — CBC WITH DIFFERENTIAL/PLATELET
Basophils Absolute: 0.1 10*3/uL (ref 0.0–0.2)
Basos: 1 %
EOS (ABSOLUTE): 0.4 10*3/uL (ref 0.0–0.4)
Eos: 8 %
Hematocrit: 44.4 % (ref 37.5–51.0)
Hemoglobin: 14.7 g/dL (ref 13.0–17.7)
Immature Grans (Abs): 0 10*3/uL (ref 0.0–0.1)
Immature Granulocytes: 1 %
Lymphocytes Absolute: 1.5 10*3/uL (ref 0.7–3.1)
Lymphs: 29 %
MCH: 30 pg (ref 26.6–33.0)
MCHC: 33.1 g/dL (ref 31.5–35.7)
MCV: 91 fL (ref 79–97)
Monocytes Absolute: 0.5 10*3/uL (ref 0.1–0.9)
Monocytes: 10 %
Neutrophils Absolute: 2.7 10*3/uL (ref 1.4–7.0)
Neutrophils: 51 %
Platelets: 244 10*3/uL (ref 150–450)
RBC: 4.9 x10E6/uL (ref 4.14–5.80)
RDW: 12.4 % (ref 11.6–15.4)
WBC: 5.2 10*3/uL (ref 3.4–10.8)

## 2023-07-23 LAB — URIC ACID: Uric Acid: 8.3 mg/dL (ref 3.8–8.4)

## 2023-07-23 LAB — LIPID PANEL WITH LDL/HDL RATIO
Cholesterol, Total: 192 mg/dL (ref 100–199)
HDL: 42 mg/dL (ref >39)
LDL Chol Calc (NIH): 129 mg/dL — ABNORMAL HIGH (ref 0–99)
LDL/HDL Ratio: 3.1 {ratio} (ref 0.0–3.6)
Triglycerides: 118 mg/dL (ref 0–149)
VLDL Cholesterol Cal: 21 mg/dL (ref 5–40)

## 2023-08-07 ENCOUNTER — Encounter: Payer: Self-pay | Admitting: Family Medicine

## 2024-07-28 ENCOUNTER — Other Ambulatory Visit: Payer: Self-pay | Admitting: Family Medicine

## 2024-07-28 DIAGNOSIS — E785 Hyperlipidemia, unspecified: Secondary | ICD-10-CM
